# Patient Record
Sex: Female | Born: 1955 | Race: White | Hispanic: No | Smoking: Former smoker
Health system: Southern US, Community
[De-identification: ages and names within clinical notes are randomized; demographics above are authoritative.]

## PROBLEM LIST (undated history)

## (undated) DIAGNOSIS — B029 Zoster without complications: Secondary | ICD-10-CM

## (undated) DIAGNOSIS — N200 Calculus of kidney: Secondary | ICD-10-CM

## (undated) DIAGNOSIS — M1990 Unspecified osteoarthritis, unspecified site: Secondary | ICD-10-CM

## (undated) DIAGNOSIS — N816 Rectocele: Secondary | ICD-10-CM

## (undated) DIAGNOSIS — I10 Essential (primary) hypertension: Secondary | ICD-10-CM

## (undated) DIAGNOSIS — M65311 Trigger thumb, right thumb: Secondary | ICD-10-CM

## (undated) DIAGNOSIS — F4310 Post-traumatic stress disorder, unspecified: Secondary | ICD-10-CM

## (undated) HISTORY — PX: TONSILLECTOMY: SUR1361

## (undated) HISTORY — PX: SPINAL FUSION: SHX223

## (undated) HISTORY — PX: OOPHORECTOMY: SHX86

## (undated) HISTORY — PX: KNEE SURGERY: SHX244

## (undated) HISTORY — PX: APPENDECTOMY: SHX54

## (undated) HISTORY — PX: CHOLECYSTECTOMY: SHX55

## (undated) HISTORY — DX: Trigger thumb, right thumb: M65.311

## (undated) HISTORY — PX: HERNIA REPAIR: SHX51

## (undated) HISTORY — PX: OTHER SURGICAL HISTORY: SHX169

---

## 2001-09-17 ENCOUNTER — Observation Stay (HOSPITAL_COMMUNITY): Admission: RE | Admit: 2001-09-17 | Discharge: 2001-09-17 | Payer: Self-pay | Admitting: General Surgery

## 2001-09-18 ENCOUNTER — Emergency Department (HOSPITAL_COMMUNITY): Admission: EM | Admit: 2001-09-18 | Discharge: 2001-09-18 | Payer: Self-pay | Admitting: Emergency Medicine

## 2002-09-13 ENCOUNTER — Emergency Department (HOSPITAL_COMMUNITY): Admission: EM | Admit: 2002-09-13 | Discharge: 2002-09-13 | Payer: Self-pay | Admitting: Emergency Medicine

## 2003-09-10 ENCOUNTER — Other Ambulatory Visit: Admission: RE | Admit: 2003-09-10 | Discharge: 2003-09-10 | Payer: Self-pay | Admitting: Gynecology

## 2004-07-19 ENCOUNTER — Emergency Department (HOSPITAL_COMMUNITY): Admission: EM | Admit: 2004-07-19 | Discharge: 2004-07-19 | Payer: Self-pay | Admitting: Emergency Medicine

## 2004-10-22 ENCOUNTER — Emergency Department (HOSPITAL_COMMUNITY): Admission: EM | Admit: 2004-10-22 | Discharge: 2004-10-22 | Payer: Self-pay | Admitting: Emergency Medicine

## 2006-07-17 ENCOUNTER — Emergency Department (HOSPITAL_COMMUNITY): Admission: EM | Admit: 2006-07-17 | Discharge: 2006-07-17 | Payer: Self-pay | Admitting: Emergency Medicine

## 2006-11-17 ENCOUNTER — Emergency Department (HOSPITAL_COMMUNITY): Admission: EM | Admit: 2006-11-17 | Discharge: 2006-11-17 | Payer: Self-pay | Admitting: Emergency Medicine

## 2007-01-02 ENCOUNTER — Emergency Department (HOSPITAL_COMMUNITY): Admission: EM | Admit: 2007-01-02 | Discharge: 2007-01-02 | Payer: Self-pay | Admitting: Family Medicine

## 2007-02-26 ENCOUNTER — Emergency Department (HOSPITAL_COMMUNITY): Admission: EM | Admit: 2007-02-26 | Discharge: 2007-02-26 | Payer: Self-pay | Admitting: Emergency Medicine

## 2007-04-04 ENCOUNTER — Emergency Department (HOSPITAL_COMMUNITY): Admission: EM | Admit: 2007-04-04 | Discharge: 2007-04-05 | Payer: Self-pay | Admitting: Emergency Medicine

## 2007-04-17 ENCOUNTER — Emergency Department (HOSPITAL_COMMUNITY): Admission: EM | Admit: 2007-04-17 | Discharge: 2007-04-17 | Payer: Self-pay | Admitting: Family Medicine

## 2007-09-05 ENCOUNTER — Emergency Department (HOSPITAL_COMMUNITY): Admission: EM | Admit: 2007-09-05 | Discharge: 2007-09-05 | Payer: Self-pay | Admitting: Emergency Medicine

## 2008-04-24 ENCOUNTER — Emergency Department (HOSPITAL_COMMUNITY): Admission: EM | Admit: 2008-04-24 | Discharge: 2008-04-24 | Payer: Self-pay | Admitting: Emergency Medicine

## 2008-07-13 ENCOUNTER — Ambulatory Visit: Payer: Self-pay | Admitting: Internal Medicine

## 2008-07-13 ENCOUNTER — Ambulatory Visit: Payer: Self-pay | Admitting: Vascular Surgery

## 2008-07-13 ENCOUNTER — Encounter (INDEPENDENT_AMBULATORY_CARE_PROVIDER_SITE_OTHER): Payer: Self-pay | Admitting: Internal Medicine

## 2008-07-13 ENCOUNTER — Inpatient Hospital Stay (HOSPITAL_COMMUNITY): Admission: EM | Admit: 2008-07-13 | Discharge: 2008-07-14 | Payer: Self-pay | Admitting: Neurology

## 2009-08-23 ENCOUNTER — Emergency Department (HOSPITAL_COMMUNITY): Admission: EM | Admit: 2009-08-23 | Discharge: 2009-08-24 | Payer: Self-pay | Admitting: Emergency Medicine

## 2010-01-21 ENCOUNTER — Emergency Department (HOSPITAL_COMMUNITY): Admission: EM | Admit: 2010-01-21 | Discharge: 2010-01-21 | Payer: Self-pay | Admitting: Emergency Medicine

## 2010-07-16 LAB — CBC
HCT: 33.3 % — ABNORMAL LOW (ref 36.0–46.0)
HCT: 38.2 % (ref 36.0–46.0)
Hemoglobin: 11.3 g/dL — ABNORMAL LOW (ref 12.0–15.0)
MCHC: 34 g/dL (ref 30.0–36.0)
MCV: 89.4 fL (ref 78.0–100.0)
Platelets: 302 10*3/uL (ref 150–400)
RBC: 3.72 MIL/uL — ABNORMAL LOW (ref 3.87–5.11)
WBC: 8.6 10*3/uL (ref 4.0–10.5)

## 2010-07-16 LAB — COMPREHENSIVE METABOLIC PANEL
ALT: 19 U/L (ref 0–35)
ALT: 26 U/L (ref 0–35)
AST: 23 U/L (ref 0–37)
Albumin: 4 g/dL (ref 3.5–5.2)
Alkaline Phosphatase: 98 U/L (ref 39–117)
BUN: 15 mg/dL (ref 6–23)
BUN: 21 mg/dL (ref 6–23)
CO2: 26 mEq/L (ref 19–32)
CO2: 28 mEq/L (ref 19–32)
Calcium: 8.3 mg/dL — ABNORMAL LOW (ref 8.4–10.5)
Chloride: 105 mEq/L (ref 96–112)
Chloride: 99 mEq/L (ref 96–112)
Creatinine, Ser: 0.82 mg/dL (ref 0.4–1.2)
Creatinine, Ser: 1.35 mg/dL — ABNORMAL HIGH (ref 0.4–1.2)
GFR calc Af Amer: 50 mL/min — ABNORMAL LOW (ref >60)
Glucose, Bld: 155 mg/dL — ABNORMAL HIGH (ref 70–99)
Potassium: 3.3 mEq/L — ABNORMAL LOW (ref 3.5–5.1)
Potassium: 4.1 mEq/L (ref 3.5–5.1)
Sodium: 138 mEq/L (ref 135–145)
Sodium: 138 mEq/L (ref 135–145)
Total Bilirubin: 0.5 mg/dL (ref 0.3–1.2)
Total Protein: 5.7 g/dL — ABNORMAL LOW (ref 6.0–8.3)

## 2010-07-16 LAB — TROPONIN I: Troponin I: 0.04 ng/mL (ref 0.00–0.06)

## 2010-07-16 LAB — URINE CULTURE: Colony Count: 7000

## 2010-07-16 LAB — URINALYSIS, ROUTINE W REFLEX MICROSCOPIC
Ketones, ur: NEGATIVE mg/dL
Nitrite: NEGATIVE
Protein, ur: NEGATIVE mg/dL

## 2010-07-16 LAB — DIFFERENTIAL
Basophils Absolute: 0.1 10*3/uL (ref 0.0–0.1)
Basophils Relative: 1 % (ref 0–1)
Lymphocytes Relative: 19 % (ref 12–46)
Lymphs Abs: 2.5 10*3/uL (ref 0.7–4.0)
Monocytes Absolute: 0.7 10*3/uL (ref 0.1–1.0)
Neutrophils Relative %: 72 % (ref 43–77)

## 2010-07-16 LAB — RAPID URINE DRUG SCREEN, HOSP PERFORMED
Amphetamines: NOT DETECTED
Barbiturates: NOT DETECTED
Opiates: POSITIVE — AB
Tetrahydrocannabinol: NOT DETECTED

## 2010-07-16 LAB — T4, FREE: Free T4: 0.89 ng/dL (ref 0.80–1.80)

## 2010-08-19 NOTE — Discharge Summary (Signed)
Jane Murphy, Jane Murphy           ACCOUNT NO.:  [id number]   MEDICAL RECORD NO.:  [id number]          PATIENT TYPE:  INP   LOCATION:  1437                         FACILITY:  [facility]   PHYSICIAN:  Elliot Cousin, M.D.    DATE OF BIRTH:  03-02-1956   DATE OF ADMISSION:  07/13/2008  DATE OF DISCHARGE:  07/14/2008                               DISCHARGE SUMMARY   DISCHARGE DIAGNOSES:  1. Syncope.  Etiology unclear.  However, orthostatic hypotension and a      vasovagal reaction were considered.  2. Relative hypotension with history of hypertension.  3. Transient prolonged QTC and QT interval.  Completely resolved.  4. Reactive leukocytosis.  Completely resolved.  5. Acute renal insufficiency.  Completely resolved with IV fluids.  6. Hyperglycemia.  Hemoglobin A1c well within normal limits.   DISCHARGE MEDICATIONS:  1. Benicar HCTZ 10/12.5 mg daily (hold for at least 3 days and then      restart if your systolic blood pressure is greater than 110).  2. EpiPen p.r.n.   DISCHARGE DISPOSITION:  The patient is currently stable and in improved  condition.  She was advised to follow up with a primary care physician  of choice.  She was previously followed by Dr. Concepcion Elk.  The patient was  advised to follow up with her new primary care physician in  approximately 2 - 4 weeks.   CONSULTATIONS:  None.   PROCEDURE PERFORMED:  1. A 2D echocardiogram on July 13, 2008.  The results revealed an      overall left ventricular systolic function that was normal.      Ejection fraction estimated to be 55% - 65%.  No significant      valvular abnormalities.  2. Bilateral carotid Doppler study.  The preliminary results revealed      no bilateral ICA stenosis.  Vertebral flow antegrade.  3. CT scan of the head without contrast on July 13, 2008.  The results      revealed no significant intracranial abnormality.  4. Chest x-ray on July 13, 2008.  The results revealed no active      cardiopulmonary  disease.   HISTORY OF PRESENT ILLNESS:  The patient is a 55 year old woman with a  past medical history significant for hypertension, postherpetic  neuralgia, and multiple medication allergies.  She presented to the  emergency department on July 13, 2008 after experiencing a syncopal  episode.  When the patient was evaluated in the emergency department,  her initial blood pressure was borderline low at 90/53.  However, with  gentle IV fluids, it did increase to 97/55 before she was transferred to  the floor.  Her EKG revealed nonspecific T-wave abnormalities and a  prolonged QTC of 499.  Her CT scan of the head revealed no acute  intracranial abnormalities.  Her D-dimer was within normal limits at  0.30.  Her serum potassium was borderline low at 3.3.  Her creatinine  was slightly elevated at 1.35.  Her WBC was slightly elevated at 13.1.  The patient was admitted for further evaluation and management.   For additional details please see the dictated  history and physical.   HOSPITAL COURSE:  SYNCOPE.  As indicated above, the patient was started  on gentle IV fluids for volume repletion.  Given her relative  hypotension and elevated creatinine, the patient was essentially volume  depleted.  She has a history of hypertension and had been taking Benicar  HCTZ on a daily basis.  The Benicar HCTZ was withheld during the  hospitalization.  Because of the abnormalities seen on the EKG, a blood  magnesium level was ordered.  It was within normal limits at 2.3.  She  was started on potassium supplementation orally and in the IV fluids for  mild hypokalemia.  Cardiac enzymes were ordered to rule out myocardial  ischemia.  All of her cardiac enzymes were completely normal.  Her  thyroid function was assessed with a TSH and free T4.  Both were within  normal limits with the TSH being 1.88 and the free T4 being 0.89.  Her  urinalysis was essentially negative for infection.  There was mild   hyperglycemia at the time of the initial hospital assessment however it  resolved prior to hospital discharge.  Her hemoglobin A1c was noted to  be within normal limits at 5.6.  For additional evaluation, a urine drug  screen, 2D echocardiogram, and carotid Doppler study were ordered.  The  2D echocardiogram revealed preserved LV function and no obvious valvular  abnormalities.  The carotid Doppler study was negative for ICA stenosis.  Her urine drug screen was positive for opiates.  The patient denied  taking any opiates recently at home.  She said that she did take an over-  the-counter cough medication during the day prior to the hospital  admission.  She says that the cough medication was over-the-counter and  not prescribed.  It is possible that the medication in the over-the-  counter cough syrup caused a cross reaction with the lab test and  therefore the urine drug screen became positive for opiates.  Of note,  the patient did request Vicodin from the registered nurse this morning  for some nonspecific pain, however, it was not given.   The patient's follow-up electrolyte panel was essentially within normal  limits.  Her serum potassium is currently 4.1 and her white blood cell  count is 8.6 improved from 13.1.  She is neurologically intact.  She is  ambulating in her room.  There are no focal neurological findings.   It is possible that the patient may have experienced a vasovagal  reaction coupled with orthostasis.  These are the potential etiologies  of her syncope.  The patient was advised to withhold taking Benicar HCTZ  for at least 3 days and to avoid dehydration.  She was advised to  restart the Benicar HCTZ in 3 - 4 days if her systolic blood pressure is  greater than 110.  She voiced understanding.      Elliot Cousin, M.D.  Electronically Signed     DF/MEDQ  D:  07/14/2008  T:  07/14/2008  Job:  [id number]

## 2010-08-19 NOTE — H&P (Signed)
Jane Murphy, Jane Murphy           ACCOUNT NO.:  [id number]   MEDICAL RECORD NO.:  [id number]          PATIENT TYPE:  INP   LOCATION:  1437                         FACILITY:  [facility]   PHYSICIAN:  Elliot Cousin, M.D.    DATE OF BIRTH:  07-Mar-1956   DATE OF ADMISSION:  07/13/2008  DATE OF DISCHARGE:                              HISTORY & PHYSICAL   CHIEF COMPLAINT:  I passed out.   HISTORY OF PRESENT ILLNESS:  The patient is a 55 year old woman with a  past medical history significant for hypertension, postherpetic  neuralgia, and multiple medication allergies, who presented to the  emergency department last night after apparently experiencing a syncopal  episode.  At approximately midnight this morning, the patient ate a  grilled cheese sandwich along with a bowl of tomato soup.  Prior to her  late supper, she had no complaints of dizziness, headache, chest pain,  shortness of breath, nausea, vomiting, diarrhea, swelling in her legs,  painful urination, or fever and chills.  Approximately 1 hour later, she  became nauseated.  She went to her bathroom and sat on the toilet with a  closed  lid.  All of a certain, she heard a boom.  She found herself  on the floor and emesis was scattered over her face.  Her son came into  the bathroom and found her there.  She was somewhat disoriented however  she does not recall what happened other than hearing a boom and passing  out on the floor.  She apparently came to within a few seconds.  According to her son, there was some urinary incontinence.  There was no  stool incontinence.  Her only new medication was a Tussin cough syrup  that she had taken earlier in the day.  She also has a history of  syncope associated with orthostatic hypotension after significant  intentional weight loss and an increase in dose of Benicar  approximately a year or two ago.  As indicated above, the patient had no  other symptoms prior to her syncopal episode.  She  also notes that she  is allergic to MSG and she is not quite sure if MSG was in the tomato  soup that she ate.  She also notes that she has a number of medication  allergies, at least one of which can cause an anaphylactic reaction.   During the evaluation in the emergency department, the patient was noted  to be afebrile.  Her blood pressure was initially 90/53, however with  gentle IV fluids, it increased to 97/55.  Her EKG revealed nonspecific T-  wave abnormalities and a prolonged QTC of 499.  CT scan of the head  ordered by the emergency department physician revealed no acute  intracranial abnormality.  Her chest x-ray revealed no active  cardiopulmonary disease.  Her lab data were  significant for a normal D-  dimer of 0.30, a low serum potassium of 3.3, an elevated creatinine of  1.35, and a normal magnesium of 2.3.  Her WBC was also slightly elevated  at 13.1.  The patient was admitted for further evaluation and  management.   PAST MEDICAL HISTORY:  1. Hypertension.  2. History of herpes zoster  3. Postherpetic neuralgia.  4. Multiple medication allergies.  5. Status post two C-sections  6. Status post left salpingo-oophorectomy secondary to scar tissue  7. Right salpingectomy.  8. Status post umbilical hernia repair in the past.  9. Status post severe spider bite of the right arm..   ALLERGIES:  THE PATIENT HAS AN ALLERGY TO CODEINE WHICH CAUSES ITCHING,  COMPAZINE WHICH CAUSES HIVES, DARVOCET N 100 WHICH CAUSES HIVES AND  WHEEZING, LYRICA  WHICH CAUSES HIVES, MORPHINE WHICH CAUSES AN  ANAPHYLACTIC REACTION, ALL NSAIDS WHICH CAUSE HIVES, NEURONTIN WHICH  CAUSES WHEEZING, AND PHENERGAN WHICH CAUSES HIVES.   MEDICATIONS:  1. Benicar 12.5 mg daily.  2. Epipen as needed.   SOCIAL HISTORY:  The patient is widowed.  [place]  She is self-employed as a Secretary/administrator.  She has  two sons.  She drinks beer on occasion.  She denies  tobacco and illicit  drug use.   FAMILY HISTORY:  Her mother is 60 years of age and has no significant  medical history.  Her father is 48 years of age and only has a history  of skin cancer.   REVIEW OF SYSTEMS:  As above.   PHYSICAL EXAMINATION:  Temperature 99.6, blood pressure 93/53, pulse 97,  respiratory rate 18, oxygen saturation 99% on room air.  GENERAL:  The patient is a pleasant obese 55 year old Caucasian woman  who is currently sitting up in bed in no acute distress.  HEENT:  Head  is normocephalic nontraumatic.  I do not see any evidence of head trauma  or scalp trauma.  Pupils equal, round, reactive to light.  Extraocular  muscles are intact.  Conjunctivae are clear.  Sclerae are white.  Tympanic membranes not examined.  Nasal mucosa is mildly dry.  No sinus  tenderness.  Oropharynx reveals good dentition.  Mucous membranes are  mildly dry.  No posterior exudates or erythema.  NECK:  Supple.  No adenopathy, no thyromegaly, no bruit or JVD.  LUNGS:  Clear to auscultation bilaterally.  HEART:  S1-S2 with no murmurs, rubs or gallops.  ABDOMEN:  Positive bowel sounds, obese, nontender, nondistended.  No  hepatosplenomegaly, no masses palpated.  EXTREMITIES:  Pedal pulses are  palpable bilaterally.  No pretibial edema.  No pedal edema.  NEUROLOGIC:  The patient is alert and oriented x3.  Cranial nerves II-  XII are intact.  Strength is 5/5 throughout.  Sensation is intact.  Gait  is within normal limits.  No nystagmus.  No pronator drift.   INITIAL LABORATORIES:  EKG reveals normal sinus rhythm with a heart rate  of 92 beats per minute, nonspecific T-wave abnormalities, prolonged QT  404/499.  Chest x-ray and CT scan of the head results as above.  WBC  13.1, hemoglobin 12.9, platelets 302, D-dimer 0.30.  Sodium 138,  potassium 3.3, chloride 99, CO2 28, glucose 155, BUN 21, creatinine  1.35, total bilirubin 0.8, alkaline phosphatase 98, SGOT 37, SGPT 26,  total protein  7.0, albumin 4.0, calcium 9.2, magnesium 2.3.   ASSESSMENT:  1. Syncope.  The etiology is unclear at this time.  We are considering      orthostatic hypotension as a potential cause and vasovagal syncope      secondary to nausea and vomiting  and perhaps micturition.  Also of      note, the patient does have a  history of severe allergies and she      wonders if she reacted to MSG which caused her to become nauseated      which may have subsequently caused her to pass out.  She is      currently alert and oriented and has no focal neurological findings      on exam.  She does have a prolonged QT interval and this is a      concern.  Her blood magnesium is within normal limits, however.  2. Prolonged QT interval.  This finding may be incidental however, a      chronic abnormality is a concern.  This will be monitored during      the hospitalization.  As indicated above, her blood magnesium level      was within normal limits.  The EKG abnormalities may also be a      consequence of hypokalemia, although the hypokalemia is not      profound.  3. Mild hypokalemia.  The patient's serum potassium is 3.3.  This      could be a consequence of the HCT portion of her antihypertensive      medication therapy.  4. Relative hypotension.  As above, the patient may have experienced      orthostatic changes that could have led to the syncopal episode.  5. Leukocytosis.  The patient has no evidence of fever.  The      leukocytosis may simply be reactive.  6. Acute renal insufficiency.  The patient's creatinine is 1.35 which      in addition to the relative hypotension, leads one to believe that      the patient is volume depleted.  7. Hyperglycemia.  The patient's glucose is 155.  She gives no history      of diabetes mellitus.  It is unclear whether or not the patient      received glucose and/or dextrose via the IV fluids in the emergency      department.   PLAN:  1. The patient is being admitted  for further evaluation and      management.  2. Will replete her potassium chloride orally and in the IV fluids.  3. Will start IV fluid volume repletion with normal saline with      potassium chloride added.  Will hold the Benicar HCTZ for now.  4. Will recheck a follow-up EKG in the morning.  5. For further evaluation, will check orthostatic vital signs, cardiac      enzymes, 2-D echocardiogram, and a carotid Doppler study.  6. Will check a urinalysis to rule out infection.  Will check a urine      drug screen to rule out any illicit drug use.      Elliot Cousin, M.D.  Electronically Signed     DF/MEDQ  D:  07/13/2008  T:  07/13/2008  Job:  [id number]

## 2010-08-22 NOTE — Op Note (Signed)
[facility]  Patient:    MODEAN, MCCULLUM Visit Number: [id number] MRN: [id number]          Service Type: OBV Location: 4W 0478 01 Attending Physician:  Brandy Hale Dictated by:   Angelia Mould. Derrell Lolling, M.D. Proc. Date: 09/17/01 Admit Date:  09/17/2001   CC:         [facility]   Operative Report  PREOPERATIVE DIAGNOSIS:  Recurrent ventral hernia.  POSTOPERATIVE DIAGNOSIS:  Recurrent ventral hernia.  OPERATION PERFORMED:  Laparoscopic repair of recurrent ventral hernia using 12 cm x 12 cm parietex composite mesh.  SURGEON:  Angelia Mould. Derrell Lolling, M.D.  INDICATIONS FOR PROCEDURE:  This is a 55 year old white female who had a painful hernia around her umbilicus which was repaired about one year ago. I am not sure of the exact technique. She developed sudden pain in her umbilicus in April of this year while lifting something at work. She developed a bulge. The bulge and the pain had been getting worse. On exam, she is obese and has a periumbilical hernia with a defect almost 3 cm in diameter and a well healed skin scar. This seems reducible. This is tender. She is brought to the operating room for repair.  OPERATIVE TECHNIQUE:  Following the induction of general endotracheal anesthesia, a Foley catheter was inserted to empty the bladder. She was given intravenous antibiotics. The abdomen was prepped and draped in a sterile fashion. The 2 cm long transverse incision was made in the left lateral abdomen. The external oblique, internal oblique and transversalis abdominis muscle layers were divided under direct vision and the abdominal cavity entered carefully under direct vision in an atraumatic fashion. A 10 mm Hasson trocar was inserted and secured with a pursestring suture of #0 Vicryl. Pneumoperitoneum was created. We found a hernia defect almost 4 cm in diameter in the periumbilical area. There were some omental adhesions  to this which were taken down. We inserted a 5 mm trocar in the left upper quadrant, 5 mm trocar in the left lower quadrant, and a 5 mm trocar in the right mid abdomen. The omental adhesions were divided carefully and under direct vision freeing up the edges of the hernia sac. Spinal needles were passed through the abdominal wall superiorly, inferiorly on the right and on the left to mark the edges of the hernia defect. We then marked a circle around this to mark the perimeter of the mesh so that we could have mesh overlapping this area 3-4 cm in all directions and that wound up being about 12 cm in diameter as a fairly regular circle. This was marked on the abdominal wall. We brought a 12 cm x 12 cm piece of parietex composite mesh with polyester on one side and a hydrophilic polyethylene glycol film on the other side. The mesh was marked superiorly inferior right and left. Mattress sutures of #0 Novofil are placed in the mesh at the 4 points of the compass. The mesh was then rolled up and inserted into the abdomen and then opened up and oriented being careful to place the hydrophilic polyethylene glycol surface toward the intestine and the polyester mesh toward the abdominal wall. Using four incisions at the 4 points of the compass, we then passed the sutures from the abdominal cavity out being sure to take at least a 1 cm bite of the fascia. Once all of these sutures were passed, we then lifted the mesh up and we found that it covered the  defect nicely and had no redundancy or folds in the mesh. We tied all four sutures which secured the mesh at the 4 points on the compass.  We used a 5 mm tacker and placed about 25 or so 5 mm metal tacks to secure the mesh at the perimeter and a few more centrally. This provided a very secure coverage with good fixation.  There was no bleeding at this point. When we removed the 5 mm trocar in the left lower quadrant, we found some bleeding from that  site and we controlled that nicely with a #0 Vicryl suture passed around the fascia with the endostitch device. This controlled the bleeding. The rest of the incisions looked fine. The rest of the trocars were removed and there was no bleeding. The pneumoperitoneum was released. The fascia and the left flank incision was closed with #0 Vicryl sutures. The skin incisions were closed with subcuticular sutures of 4-0 Vicryl and Steri-Strips. Clean bandages were placed and the patient taken to the recovery room in stable condition. Estimated blood loss was about 25 cc. Complications none. Sponge, needle and instrument counts were correct. Dictated by:   Angelia Mould. Derrell Lolling, M.D. Attending Physician:  Brandy Hale DD:  09/17/01 TD:  09/18/01 Job: [id number]

## 2010-10-24 ENCOUNTER — Emergency Department (HOSPITAL_COMMUNITY)
Admission: EM | Admit: 2010-10-24 | Discharge: 2010-10-24 | Disposition: A | Discharge status: Home / Self Care | Payer: Self-pay | Attending: Emergency Medicine | Admitting: Emergency Medicine

## 2010-10-24 DIAGNOSIS — E7800 Pure hypercholesterolemia, unspecified: Secondary | ICD-10-CM | POA: Insufficient documentation

## 2010-10-24 DIAGNOSIS — M545 Low back pain, unspecified: Secondary | ICD-10-CM | POA: Insufficient documentation

## 2010-10-24 DIAGNOSIS — I10 Essential (primary) hypertension: Secondary | ICD-10-CM | POA: Insufficient documentation

## 2010-10-24 DIAGNOSIS — M5430 Sciatica, unspecified side: Secondary | ICD-10-CM | POA: Insufficient documentation

## 2010-12-06 ENCOUNTER — Emergency Department (HOSPITAL_COMMUNITY)
Admission: EM | Admit: 2010-12-06 | Discharge: 2010-12-06 | Disposition: A | Discharge status: Home / Self Care | Payer: Non-veteran care | Attending: Emergency Medicine | Admitting: Emergency Medicine

## 2010-12-06 DIAGNOSIS — F3289 Other specified depressive episodes: Secondary | ICD-10-CM | POA: Insufficient documentation

## 2010-12-06 DIAGNOSIS — B029 Zoster without complications: Secondary | ICD-10-CM | POA: Insufficient documentation

## 2010-12-06 DIAGNOSIS — I10 Essential (primary) hypertension: Secondary | ICD-10-CM | POA: Insufficient documentation

## 2010-12-06 DIAGNOSIS — E7800 Pure hypercholesterolemia, unspecified: Secondary | ICD-10-CM | POA: Insufficient documentation

## 2010-12-06 DIAGNOSIS — F329 Major depressive disorder, single episode, unspecified: Secondary | ICD-10-CM | POA: Insufficient documentation

## 2011-05-03 ENCOUNTER — Emergency Department (HOSPITAL_COMMUNITY): Payer: Non-veteran care

## 2011-05-03 ENCOUNTER — Encounter (HOSPITAL_COMMUNITY): Payer: Self-pay | Admitting: *Deleted

## 2011-05-03 ENCOUNTER — Emergency Department (HOSPITAL_COMMUNITY)
Admission: EM | Admit: 2011-05-03 | Discharge: 2011-05-03 | Disposition: A | Discharge status: Home / Self Care | Payer: Non-veteran care | Attending: Emergency Medicine | Admitting: Emergency Medicine

## 2011-05-03 DIAGNOSIS — W108XXA Fall (on) (from) other stairs and steps, initial encounter: Secondary | ICD-10-CM | POA: Insufficient documentation

## 2011-05-03 DIAGNOSIS — R079 Chest pain, unspecified: Secondary | ICD-10-CM | POA: Insufficient documentation

## 2011-05-03 DIAGNOSIS — R071 Chest pain on breathing: Secondary | ICD-10-CM | POA: Insufficient documentation

## 2011-05-03 DIAGNOSIS — R0789 Other chest pain: Secondary | ICD-10-CM

## 2011-05-03 DIAGNOSIS — S335XXA Sprain of ligaments of lumbar spine, initial encounter: Secondary | ICD-10-CM | POA: Insufficient documentation

## 2011-05-03 HISTORY — DX: Zoster without complications: B02.9

## 2011-05-03 HISTORY — DX: Post-traumatic stress disorder, unspecified: F43.10

## 2011-05-03 HISTORY — DX: Unspecified osteoarthritis, unspecified site: M19.90

## 2011-05-03 HISTORY — DX: Essential (primary) hypertension: I10

## 2011-05-03 HISTORY — DX: Rectocele: N81.6

## 2011-05-03 MED ORDER — OXYCODONE-ACETAMINOPHEN 5-325 MG PO TABS: 1.0000 | ORAL_TABLET | ORAL | Status: AC | PRN

## 2011-05-03 MED ORDER — OXYCODONE-ACETAMINOPHEN 5-325 MG PO TABS
2.0000 | ORAL_TABLET | Freq: Once | ORAL | Status: AC
  Administered 2011-05-03: 2 via ORAL
  Filled 2011-05-03: qty 2

## 2011-05-03 NOTE — ED Provider Notes (Signed)
History     CSN: [id number]  Arrival date & time 05/03/11  0865   First MD Initiated Contact with Patient 05/03/11 1156      Chief Complaint  Patient presents with  . Fall  . Back Pain    Left lower  . Leg Pain    left    (Consider location/radiation/quality/duration/timing/severity/associated sxs/prior treatment) The history is provided by the patient.   the patient reports slipping while walking down stairs 3 days ago.  She reports striking her right lateral chest on the arm rail.  She denies head or neck pain.  She denies loss consciousness.  She had no head injury.  She also reports pain in her lower back from twisting.  She did not fall and strike her back.  She denies weakness in her upper or lower extremities.  She has no shortness of breath.  She reports her pain is worse by deep breathing.  Nothing improves her pain.  She's only been trying Tylenol for pain.  Her pain is moderate in severity.  Her pain is constant.  She denies fevers and chills.  She denies bowel pain.  She denies nausea vomiting or diarrhea.  She denies lightheadedness or weakness.  Past Medical History  Diagnosis Date  . Arthritis   . Osteoarthritis   . Hypertension   . PTSD (post-traumatic stress disorder)   . Shingles   . Rectocele     Past Surgical History  Procedure Date  . C section   . Oophorectomy   . Hernia repair   . Tonsillectomy   . Appendectomy   . Cholecystectomy     History reviewed. No pertinent family history.  History  Substance Use Topics  . Smoking status: Never Smoker   . Smokeless tobacco: Never Used  . Alcohol Use: Yes     occ    OB History    Grav Para Term Preterm Abortions TAB SAB Ect Mult Living                  Review of Systems  Musculoskeletal: Positive for back pain.  All other systems reviewed and are negative.    Allergies  Aspirin; Nsaids; Other; and Food  Home Medications  No current outpatient prescriptions on file.  BP 135/93  Pulse  88  Temp(Src) 97.6 F (36.4 C) (Oral)  Resp 16  Ht 5\' 7"  (1.702 m)  Wt 240 lb (108.863 kg)  BMI 37.59 kg/m2  SpO2 97%  Physical Exam  Nursing note and vitals reviewed. Constitutional: She is oriented to person, place, and time. She appears well-developed and well-nourished. No distress.  HENT:  Head: Normocephalic and atraumatic.  Eyes: EOM are normal.  Neck: Normal range of motion.  Cardiovascular: Normal rate, regular rhythm and normal heart sounds.   Pulmonary/Chest: Effort normal and breath sounds normal.       Right chest wall tenderness without deformity.  No crepitus noted.  Lung sounds are clear and equal bilaterally  Abdominal: Soft. She exhibits no distension. There is no tenderness.  Musculoskeletal: Normal range of motion.       Mild lumbar spinal tenderness or paraspinal tenderness.  No obvious step-offs.  Normal motor strength in her bilateral lower extremitties  Neurological: She is alert and oriented to person, place, and time.  Skin: Skin is warm and dry.  Psychiatric: She has a normal mood and affect. Judgment normal.    ED Course  Procedures (including critical care time)  Labs Reviewed - No data  to display Dg Chest 2 View  05/03/2011  *RADIOLOGY REPORT*  Clinical Data: Fall.  Chest pain  CHEST - 2 VIEW  Comparison: 07/13/2008  Findings: The heart size and mediastinal contours are within normal limits.  Left upper lobe granuloma is similar to previous exam. Both lungs are clear.  The visualized skeletal structures are unremarkable.  IMPRESSION:  1.  No acute cardiopulmonary abnormalities.  Original Report Authenticated By: Rosealee Albee, M.D.   Dg Lumbar Spine Complete  05/03/2011  *RADIOLOGY REPORT*  Clinical Data: Fall.  Chest pain.  LUMBAR SPINE - COMPLETE 4+ VIEW  Comparison: 02/26/2007  Findings: Hernia mesh noted projecting over the lower abdomen.  Facet arthropathy is once again observed at L5-S1 and L4-5 bilaterally.  There is 3 mm of grade 1  anterolisthesis of L4 on L5, similar to the prior exam.  Reduced intervertebral disc height that L5-S1 noted, probably reflecting degenerative disc disease.  IMPRESSION:  1.  Lower lumbar facet arthropathy with reduced disc height that L5- S1, compatible with spondylosis and degenerative disc disease. 2.  Unchanged 3 mm of degenerative anterolisthesis of L4 on L5. 3.  No fracture or acute subluxation in the lumbar spine is observed.  Original Report Authenticated By: Dellia Cloud, M.D.     1. Chest wall pain   2. Lumbar sprain       MDM  Will image chest in the lumbar spine.  Pain treated in the emergency department.  Suspect contusions versus nondisplaced right rib fracture.  Chest x-ray to better deliniate.  Low suspicion for pneumothorax.        Lyanne Co, MD 05/03/11 1247

## 2011-05-03 NOTE — ED Notes (Signed)
Pt from home c/o right rib cage pain, left lower back pain radiating down left leg after slipping on ice and falling down 3 steps.

## 2011-07-01 ENCOUNTER — Emergency Department (HOSPITAL_COMMUNITY)
Admission: EM | Admit: 2011-07-01 | Discharge: 2011-07-01 | Disposition: A | Discharge status: Home / Self Care | Payer: Non-veteran care | Attending: Emergency Medicine | Admitting: Emergency Medicine

## 2011-07-01 ENCOUNTER — Encounter (HOSPITAL_COMMUNITY): Payer: Self-pay | Admitting: *Deleted

## 2011-07-01 DIAGNOSIS — M1990 Unspecified osteoarthritis, unspecified site: Secondary | ICD-10-CM | POA: Insufficient documentation

## 2011-07-01 DIAGNOSIS — M5430 Sciatica, unspecified side: Secondary | ICD-10-CM | POA: Insufficient documentation

## 2011-07-01 DIAGNOSIS — I10 Essential (primary) hypertension: Secondary | ICD-10-CM | POA: Insufficient documentation

## 2011-07-01 DIAGNOSIS — F4310 Post-traumatic stress disorder, unspecified: Secondary | ICD-10-CM | POA: Insufficient documentation

## 2011-07-01 DIAGNOSIS — M549 Dorsalgia, unspecified: Secondary | ICD-10-CM

## 2011-07-01 MED ORDER — OXYCODONE-ACETAMINOPHEN 5-325 MG PO TABS: 1.0000 | ORAL_TABLET | ORAL | Status: AC | PRN

## 2011-07-01 MED ORDER — DEXAMETHASONE SODIUM PHOSPHATE 10 MG/ML IJ SOLN
10.0000 mg | Freq: Once | INTRAMUSCULAR | Status: AC
  Administered 2011-07-01: 10 mg via INTRAMUSCULAR

## 2011-07-01 MED ORDER — DEXAMETHASONE SODIUM PHOSPHATE 10 MG/ML IJ SOLN
10.0000 mg | Freq: Once | INTRAMUSCULAR | Status: DC
  Filled 2011-07-01: qty 1

## 2011-07-01 MED ORDER — OXYCODONE-ACETAMINOPHEN 5-325 MG PO TABS
1.0000 | ORAL_TABLET | Freq: Once | ORAL | Status: AC
  Administered 2011-07-01: 1 via ORAL
  Filled 2011-07-01: qty 1

## 2011-07-01 NOTE — ED Provider Notes (Signed)
History     CSN: [id number]  Arrival date & time 07/01/11  1203   First MD Initiated Contact with Patient 07/01/11 1239      Chief Complaint  Patient presents with  . Back Pain  . Hip Pain    (Consider location/radiation/quality/duration/timing/severity/associated sxs/prior treatment) Patient is a 56 y.o. female presenting with back pain and hip pain. The history is provided by the patient. No language interpreter was used.  Back Pain  This is a chronic problem. The problem occurs constantly. The problem has been gradually worsening. The pain is associated with no known injury. The pain is at a severity of 8/10. The pain is moderate. The symptoms are aggravated by stress. The pain is worse during the day. Associated symptoms include leg pain. Pertinent negatives include no fever, no numbness, no bowel incontinence, no perianal numbness, no bladder incontinence, no dysuria, no pelvic pain, no paresthesias, no paresis, no tingling and no weakness. She has tried analgesics for the symptoms. The treatment provided mild relief. Risk factors: arthritis and osteoarthritis.  Hip Pain This is a chronic problem. The current episode started more than 1 year ago. The problem occurs daily. The problem has been gradually worsening. Pertinent negatives include no chills, fever, joint swelling, myalgias, nausea, numbness, urinary symptoms, vomiting or weakness. The symptoms are aggravated by walking. She has tried acetaminophen for the symptoms. The treatment provided mild relief.   Patient reports chronic osteoarthritis to her left knee with intermittent L lower back pain and sciatica. Presents today with sciatica to the left buttocks going down the anterior left thigh to the left knee. Was here a month ago with the same. States that the dexamethasone shot helps her pain immensely. And Percocet also helps her pain. She is scheduled for surgery he to her left knee on 11/16/2011 at the [facility]. States that  she is incontinent of urine and stool do to a rectocele and a urocele times many many years.  Denies perineal numbness or numbness in the left lower extremity. Has been taking Tylenol 1 g 3 times a day but has not had Tylenol today. The pain is keeping her up at night. Past medical history arthritis osteoarthritis hypertension and PTSD with rectocele.   Past Medical History  Diagnosis Date  . Arthritis   . Osteoarthritis   . Hypertension   . PTSD (post-traumatic stress disorder)   . Shingles   . Rectocele     Past Surgical History  Procedure Date  . C section   . Oophorectomy   . Hernia repair   . Tonsillectomy   . Appendectomy   . Cholecystectomy     No family history on file.  History  Substance Use Topics  . Smoking status: Never Smoker   . Smokeless tobacco: Never Used  . Alcohol Use: Yes     occ    OB History    Grav Para Term Preterm Abortions TAB SAB Ect Mult Living                  Review of Systems  Constitutional: Negative.  Negative for fever and chills.  HENT: Negative.   Eyes: Negative.   Respiratory: Negative.   Cardiovascular: Negative.   Gastrointestinal: Negative.  Negative for nausea, vomiting and bowel incontinence.  Genitourinary: Negative for bladder incontinence, dysuria, urgency, frequency, decreased urine volume, difficulty urinating and pelvic pain.  Musculoskeletal: Positive for back pain. Negative for myalgias and joint swelling.  Neurological: Negative.  Negative for tingling, weakness,  numbness and paresthesias.  Psychiatric/Behavioral: Negative.     Allergies  Aspirin; Nsaids; Other; and Food  Home Medications   Current Outpatient Rx  Name Route Sig Dispense Refill  . ACETAMINOPHEN 500 MG PO TABS Oral Take 1,000 mg by mouth every 6 (six) hours as needed. For pain.    Marland Kitchen VITAMIN D 1000 UNITS PO TABS Oral Take 1,000 Units by mouth daily.    Marland Kitchen LOSARTAN POTASSIUM 100 MG PO TABS Oral Take 50 mg by mouth daily.    . SERTRALINE HCL 50  MG PO TABS Oral Take 50 mg by mouth daily.      BP 152/99  Pulse 92  Temp(Src) 98 F (36.7 C) (Oral)  Resp 16  SpO2 96%  Physical Exam  Nursing note and vitals reviewed. Constitutional: She is oriented to person, place, and time. She appears well-developed and well-nourished.  HENT:  Head: Normocephalic and atraumatic.  Eyes: Conjunctivae and EOM are normal. Pupils are equal, round, and reactive to light.  Neck: Normal range of motion.  Cardiovascular: Normal rate.   Pulmonary/Chest: Effort normal.  Abdominal: Soft.  Musculoskeletal: Normal range of motion. She exhibits tenderness. She exhibits no edema.       LL back and LLE tendenderness with 2+ L knee reflex  Neurological: She is alert and oriented to person, place, and time. She has normal reflexes. No cranial nerve deficit.  Skin: Skin is warm and dry.  Psychiatric: She has a normal mood and affect.    ED Course  Procedures (including critical care time)  Labs Reviewed - No data to display No results found.   No diagnosis found.    MDM  LLback and sciatica treated in ER with Dexamethasone 10mg  and percocet with relief.  Will follow up with Pcp Dr. Vincenza Hews this week.  Keep appointment for pending surgery.  Return to ER if worse.        Jethro Bastos, NP 07/02/11 [phone]

## 2011-07-01 NOTE — ED Notes (Signed)
Pt reports sciatica flare up. Pt reports pain to left hip/lower back pain.

## 2011-07-01 NOTE — Discharge Instructions (Signed)
Jane Murphy retreated her sciatica with dexamethasone 10 mg IM and we gave you Percocet while in the ER. He had an prescription for Percocet do not drive with this medication. Do not take ibuprofen for the next few days. Get a followup appointment with the [name] hospital as his possible. Return to the ER for any severe pain or other concerns.   Back Pain, Adult Back pain is very common. The pain often gets better over time. The cause of back pain is usually not dangerous. Most people can learn to manage their back pain on their own.  HOME CARE   Stay active. Start with short walks on flat ground if you can. Try to walk farther each day.   Do not sit, drive, or stand in one place for more than 30 minutes. Do not stay in bed.   Do not avoid exercise or work. Activity can help your back heal faster.   Be careful when you bend or lift an object. Bend at your knees, keep the object close to you, and do not twist.   Sleep on a firm mattress. Lie on your side, and bend your knees. If you lie on your back, put a pillow under your knees.   Only take medicines as told by your doctor.   Put ice on the injured area.   Put ice in a plastic bag.   Place a towel between your skin and the bag.   Leave the ice on for 15 to 20 minutes, 3 to 4 times a day for the first 2 to 3 days. After that, you can switch between ice and heat packs.   Ask your doctor about back exercises or massage.   Avoid feeling anxious or stressed. Find good ways to deal with stress, such as exercise.  GET HELP RIGHT AWAY IF:   Your pain does not go away with rest or medicine.   Your pain does not go away in 1 week.   You have new problems.   You do not feel well.   The pain spreads into your legs.   You cannot control when you poop (bowel movement) or pee (urinate).   Your arms or legs feel weak or lose feeling (numbness).   You feel sick to your stomach (nauseous) or throw up (vomit).   You have belly (abdominal)  pain.   You feel like you may pass out (faint).  MAKE SURE YOU:   Understand these instructions.   Will watch your condition.   Will get help right away if you are not doing well or get worse.  Document Released: 09/09/2007 Document Revised: 03/12/2011 Document Reviewed: 08/11/2010 [facility] Patient Information 2012 [place], [place].Back Pain, Adult Back pain is very common. The pain often gets better over time. The cause of back pain is usually not dangerous. Most people can learn to manage their back pain on their own.  HOME CARE   Stay active. Start with short walks on flat ground if you can. Try to walk farther each day.   Do not sit, drive, or stand in one place for more than 30 minutes. Do not stay in bed.   Do not avoid exercise or work. Activity can help your back heal faster.   Be careful when you bend or lift an object. Bend at your knees, keep the object close to you, and do not twist.   Sleep on a firm mattress. Lie on your side, and bend your knees. If you lie on  your back, put a pillow under your knees.   Only take medicines as told by your doctor.   Put ice on the injured area.   Put ice in a plastic bag.   Place a towel between your skin and the bag.   Leave the ice on for 15 to 20 minutes, 3 to 4 times a day for the first 2 to 3 days. After that, you can switch between ice and heat packs.   Ask your doctor about back exercises or massage.   Avoid feeling anxious or stressed. Find good ways to deal with stress, such as exercise.  GET HELP RIGHT AWAY IF:   Your pain does not go away with rest or medicine.   Your pain does not go away in 1 week.   You have new problems.   You do not feel well.   The pain spreads into your legs.   You cannot control when you poop (bowel movement) or pee (urinate).   Your arms or legs feel weak or lose feeling (numbness).   You feel sick to your stomach (nauseous) or throw up (vomit).   You have belly (abdominal)  pain.   You feel like you may pass out (faint).  MAKE SURE YOU:   Understand these instructions.   Will watch your condition.   Will get help right away if you are not doing well or get worse.  Document Released: 09/09/2007 Document Revised: 03/12/2011 Document Reviewed: 08/11/2010 [facility] Patient Information 2012 [place], [place].

## 2011-07-02 NOTE — ED Provider Notes (Signed)
Medical screening examination/treatment/procedure(s) were performed by non-physician practitioner and as supervising physician I was immediately available for consultation/collaboration.   Rayaan Garguilo, MD 07/02/11 1807 

## 2011-08-27 ENCOUNTER — Encounter (HOSPITAL_COMMUNITY): Payer: Self-pay | Admitting: Family Medicine

## 2011-08-27 ENCOUNTER — Emergency Department (HOSPITAL_COMMUNITY)
Admission: EM | Admit: 2011-08-27 | Discharge: 2011-08-27 | Disposition: A | Discharge status: Home / Self Care | Payer: Non-veteran care | Attending: Emergency Medicine | Admitting: Emergency Medicine

## 2011-08-27 DIAGNOSIS — I10 Essential (primary) hypertension: Secondary | ICD-10-CM | POA: Insufficient documentation

## 2011-08-27 DIAGNOSIS — R51 Headache: Secondary | ICD-10-CM | POA: Insufficient documentation

## 2011-08-27 DIAGNOSIS — B029 Zoster without complications: Secondary | ICD-10-CM | POA: Insufficient documentation

## 2011-08-27 MED ORDER — VALACYCLOVIR HCL 1 G PO TABS: 1000.0000 mg | ORAL_TABLET | Freq: Three times a day (TID) | ORAL | Status: AC

## 2011-08-27 MED ORDER — OXYCODONE-ACETAMINOPHEN 5-325 MG PO TABS: 1.0000 | ORAL_TABLET | ORAL | Status: AC | PRN

## 2011-08-27 NOTE — ED Notes (Signed)
Pt sts she gets shingles outbreaks a couple times a year. Reports pain in nose, forehead and behind left eye started this am.

## 2011-08-27 NOTE — ED Provider Notes (Signed)
Medical screening examination/treatment/procedure(s) were performed by non-physician practitioner and as supervising physician I was immediately available for consultation/collaboration.  Manar Smalling, MD 08/27/11 2355 

## 2011-08-27 NOTE — Discharge Instructions (Signed)

## 2011-08-27 NOTE — ED Notes (Signed)
Patient states she has a history of trigeminal neuralgia and gets shingles when she is under stress. States that she started having redness and pain this morning. Needs Valtrex.

## 2011-08-27 NOTE — ED Provider Notes (Signed)
History     CSN: [id number]  Arrival date & time 08/27/11  2017   First MD Initiated Contact with Patient 08/27/11 2108      Chief Complaint  Patient presents with  . Pain    nerve pain associated with shingles    (Consider location/radiation/quality/duration/timing/severity/associated sxs/prior treatment) HPI Comments: Patient with a history of facial shingles presents with new onset of same symptoms.  States she has noticed blisters beginning to form to left forehead and she has the sensation to her left face as well.  States when she gets this, she normally takes valtrex and pain medication.  Patient is a 56 y.o. female presenting with rash. The history is provided by the patient. No language interpreter was used.  Rash  This is a new problem. The current episode started yesterday. The problem has not changed since onset.The problem is associated with nothing. There has been no fever. The rash is present on the scalp and face. The pain is at a severity of 8/10. The pain is severe. The pain has been constant since onset. Associated symptoms include blisters and pain. Pertinent negatives include no weeping. She has tried nothing for the symptoms. The treatment provided no relief.    Past Medical History  Diagnosis Date  . Arthritis   . Osteoarthritis   . Hypertension   . PTSD (post-traumatic stress disorder)   . Shingles   . Rectocele     Past Surgical History  Procedure Date  . C section   . Oophorectomy   . Hernia repair   . Tonsillectomy   . Appendectomy   . Cholecystectomy     No family history on file.  History  Substance Use Topics  . Smoking status: Never Smoker   . Smokeless tobacco: Never Used  . Alcohol Use: Yes     Occasional     OB History    Grav Para Term Preterm Abortions TAB SAB Ect Mult Living                  Review of Systems  Skin: Positive for rash.  All other systems reviewed and are negative.    Allergies  Aspirin; Nsaids;  Other; and Food  Home Medications   Current Outpatient Rx  Name Route Sig Dispense Refill  . ACETAMINOPHEN 500 MG PO TABS Oral Take 1,000 mg by mouth every 6 (six) hours as needed. For pain.    Marland Kitchen VITAMIN D 1000 UNITS PO TABS Oral Take 1,000 Units by mouth once a week. Takes every Saturdays    . LOSARTAN POTASSIUM 100 MG PO TABS Oral Take 50 mg by mouth daily.    . SERTRALINE HCL 50 MG PO TABS Oral Take 100 mg by mouth daily.       BP 178/107  Pulse 92  Temp(Src) 99 F (37.2 C) (Oral)  Resp 16  SpO2 96%  Physical Exam  Nursing note and vitals reviewed. Constitutional: She is oriented to person, place, and time. She appears well-developed and well-nourished. No distress.  HENT:  Head: Atraumatic.  Right Ear: External ear normal.  Left Ear: External ear normal.  Nose: Nose normal.  Mouth/Throat: Oropharynx is clear and moist. No oropharyngeal exudate.       Erythema and two small vesicles noted to left forehead  Eyes: Conjunctivae are normal. Pupils are equal, round, and reactive to light. No scleral icterus.  Neck: Normal range of motion. Neck supple.  Cardiovascular: Normal rate, regular rhythm and normal heart sounds.  Exam reveals no gallop and no friction rub.   No murmur heard. Pulmonary/Chest: Effort normal and breath sounds normal. No respiratory distress. She has no wheezes. She has no rales. She exhibits no tenderness.  Abdominal: Soft. Bowel sounds are normal. She exhibits no distension and no mass. There is no tenderness. There is no rebound and no guarding.  Musculoskeletal: Normal range of motion. She exhibits no edema and no tenderness.  Lymphadenopathy:    She has no cervical adenopathy.  Neurological: She is alert and oriented to person, place, and time. No cranial nerve deficit.  Skin: Skin is warm and dry. No rash noted. No erythema. No pallor.  Psychiatric: She has a normal mood and affect. Her behavior is normal. Judgment and thought content normal.    ED  Course  Procedures (including critical care time)  Labs Reviewed - No data to display No results found.   Shingles    MDM  Patient with a history of recurrent varicella infections presents with pain and burning sensation to left side of her face which is her usual presentation of this - will prescribe the valtrex and percocet for pain for short course.        Izola Price [place], [place] 08/27/11 2133

## 2011-11-21 ENCOUNTER — Emergency Department (HOSPITAL_COMMUNITY)
Admission: EM | Admit: 2011-11-21 | Discharge: 2011-11-21 | Disposition: A | Discharge status: Home / Self Care | Payer: Self-pay | Attending: Emergency Medicine | Admitting: Emergency Medicine

## 2011-11-21 ENCOUNTER — Emergency Department (HOSPITAL_COMMUNITY): Payer: Self-pay

## 2011-11-21 ENCOUNTER — Encounter (HOSPITAL_COMMUNITY): Payer: Self-pay | Admitting: Emergency Medicine

## 2011-11-21 DIAGNOSIS — W108XXA Fall (on) (from) other stairs and steps, initial encounter: Secondary | ICD-10-CM | POA: Insufficient documentation

## 2011-11-21 DIAGNOSIS — I10 Essential (primary) hypertension: Secondary | ICD-10-CM | POA: Insufficient documentation

## 2011-11-21 DIAGNOSIS — Z8739 Personal history of other diseases of the musculoskeletal system and connective tissue: Secondary | ICD-10-CM | POA: Insufficient documentation

## 2011-11-21 DIAGNOSIS — Z23 Encounter for immunization: Secondary | ICD-10-CM | POA: Insufficient documentation

## 2011-11-21 DIAGNOSIS — W19XXXA Unspecified fall, initial encounter: Secondary | ICD-10-CM

## 2011-11-21 DIAGNOSIS — M25579 Pain in unspecified ankle and joints of unspecified foot: Secondary | ICD-10-CM | POA: Insufficient documentation

## 2011-11-21 DIAGNOSIS — Z9089 Acquired absence of other organs: Secondary | ICD-10-CM | POA: Insufficient documentation

## 2011-11-21 DIAGNOSIS — M25569 Pain in unspecified knee: Secondary | ICD-10-CM | POA: Insufficient documentation

## 2011-11-21 MED ORDER — TETANUS-DIPHTH-ACELL PERTUSSIS 5-2.5-18.5 LF-MCG/0.5 IM SUSP
0.5000 mL | Freq: Once | INTRAMUSCULAR | Status: AC
  Administered 2011-11-21: 0.5 mL via INTRAMUSCULAR
  Filled 2011-11-21: qty 0.5

## 2011-11-21 MED ORDER — OXYCODONE-ACETAMINOPHEN 5-325 MG PO TABS
1.0000 | ORAL_TABLET | Freq: Once | ORAL | Status: AC
  Administered 2011-11-21: 1 via ORAL
  Filled 2011-11-21: qty 1

## 2011-11-21 MED ORDER — OXYCODONE-ACETAMINOPHEN 5-325 MG PO TABS: 1.0000 | ORAL_TABLET | Freq: Four times a day (QID) | ORAL | Status: AC | PRN

## 2011-11-21 NOTE — Progress Notes (Signed)
Orthopedic Tech Progress Note Patient Details:  Jane Murphy November 04, 1955 [id number]  Ortho Devices Type of Ortho Device: Crutches;Knee Immobilizer Ortho Device/Splint Interventions: Application   [name], [name] 11/21/2011, 7:25 PM

## 2011-11-21 NOTE — ED Notes (Signed)
Pt fells down 2-3 steps last p.m. Sustaining multiple bruises and abrasions. Right knee swollen and bruised, right ankle abrasion but denies pain, pain 3rd & 4th right toes. Left knee w/ small abrasion. Pt is scheduled for left TKR on 12/04/11, and right knee is scheduled 3 mos later.

## 2011-11-21 NOTE — Discharge Instructions (Signed)
Be sure to read and understand instructions below prior to leaving the hospital. If your symptoms persist without any improvement in 1 week it is reccommended that you follow up with orthopedics listed above. Use your pain medication as prescribed and do not operate heavy machinery while on pain medication. Note that your pain medication contains acetaminophen (Tylenol) & its is not reccommended that you use additional acetaminophen (Tylenol) while taking this medication. ° °Knee Effusion  °The medical term for having fluid in your knee is effusion.This means something is wrong inside the knee. Some of the causes of fluid in the knee may be torn cartilage, a torn ligament, or bleeding into the joint from an injury. Small tears may heal on their own with conservative treatment. Conservative means rest, limited weight bearing activity and muscle strengthening exercises. Your recovery may take up to 6 weeks. Larger tears may require surgery.  ° °TREATMENT  °Rest, ice, elevation, and compression are the basic modes of treatment.   °Apply ice to the sore area for 15 to 20 minutes, 3 to 4 times per day. Do this while you are awake for the first 2 days, or as directed. This can be stopped when the swelling goes away. Put the ice in a plastic bag and place a towel between the bag of ice and your skin.  °Keep your leg elevated when possible to lessen swelling.  °If your caregiver recommends crutches, use them as instructed for 1 week. Then, you may walk as tolerated.  °Do not drive a vehicle on pain medication. °ACTIVITY: °           - Weight bearing as tolerated °           - Exercises should be limited to pain free range of motion ° °Knee Immobilization:: This is used to support and protect an injured or painful knee. Knee immobilizers keep your knee from being used while it is healing.  °Use powder to control irritation from sweat and friction.  °Adjust the immobilizer to be firm but not tight. Signs of an immobilizer  that is too tight include:  ° Swelling.  ° Numbness.  ° Color change in your foot or ankle.  ° Increased pain.  °While resting, raise your leg above the level of your heart. This reduces throbbing and helps healing. Prop it up with pillows.  °Remove the immobilizer to bathe and sleep. Wear it other times until you see your doctor again.  °             °SEEK MEDICAL CARE IF:  °You have an increase in bruising, swelling, or pain.  °Your toes feel cold.  °Pain relief is not achieved with medications.  °EMERGENCY:: Your toes are numb or blue or you have severe pain.  °You notice redness, swelling, warmth or increasing pain in your knee.  °An unexplained oral temperature above 102° F (38.9° C) develops. ° °COLD THERAPY DIRECTIONS:  °Ice or gel packs can be used to reduce both pain and swelling. Ice is the most helpful within the first 24 to 48 hours after an injury or flareup from overusing a muscle or joint.  Ice is effective, has very few side effects, and is safe for most people to use.  ° °If you expose your skin to cold temperatures for too long or without the proper protection, you can damage your skin or nerves. Watch for signs of skin damage due to cold.  ° °HOME CARE INSTRUCTIONS  °Follow these   tips to use ice and cold packs safely.  °Place a dry or damp towel between the ice and skin. A damp towel will cool the skin more quickly, so you may need to shorten the time that the ice is used.  °For a more rapid response, add gentle compression to the ice.  °Ice for no more than 10 to 20 minutes at a time. The bonier the area you are icing, the less time it will take to get the benefits of ice.  °Check your skin after 5 minutes to make sure there are no signs of a poor response to cold or skin damage.  °Rest 20 minutes or more in between uses.  °Once your skin is numb, you can end your treatment. You can test numbness by very lightly touching your skin. The touch should be so light that you do not see the skin dimple  from the pressure of your fingertip. When using ice, most people will feel these normal sensations in this order: cold, burning, aching, and numbness.  °Do not use ice on someone who cannot communicate their responses to pain, such as small children or people with dementia.  ° °HOW TO MAKE AN ICE PACK  °To make an ice pack, do one of the following:  °Place crushed ice or a bag of frozen vegetables in a sealable plastic bag. Squeeze out the excess air. Place this bag inside another plastic bag. Slide the bag into a pillowcase or place a damp towel between your skin and the bag.  °Mix 3 parts water with 1 part rubbing alcohol. Freeze the mixture in a sealable plastic bag. When you remove the mixture from the freezer, it will be slushy. Squeeze out the excess air. Place this bag inside another plastic bag. Slide the bag into a pillowcase or place a damp towel between your s ° ° ° ° ° °

## 2011-11-21 NOTE — ED Notes (Signed)
Ice pack provided, ortho called for knee immobilizer and crutches

## 2011-11-22 NOTE — ED Provider Notes (Signed)
History     CSN: [id number]  Arrival date & time 11/21/11  1716   First MD Initiated Contact with Patient 11/21/11 1748      Chief Complaint  Patient presents with  . Fall    (Consider location/radiation/quality/duration/timing/severity/associated sxs/prior treatment) Patient is a 56 y.o. female presenting with fall. The history is provided by the patient.  Fall The accident occurred 3 to 5 hours ago. The fall occurred while walking (down 2-3 steps). She fell from a height of 3 to 5 ft. She landed on a hard floor. There was no blood loss. The point of impact was the right knee. The pain is present in the right knee. The pain is at a severity of 8/10. The pain is moderate. She was ambulatory at the scene (with limp & cane). There was no entrapment after the fall. There was no drug use involved in the accident. There was no alcohol use involved in the accident. Pertinent negatives include no visual change, no fever, no numbness, no abdominal pain, no bowel incontinence, no nausea, no vomiting, no hematuria, no headaches, no hearing loss, no loss of consciousness and no tingling. The symptoms are aggravated by activity, standing, flexion, pressure on the injury, use of the injured limb, ambulation, extension and rotation. She has tried ice for the symptoms. The treatment provided no relief.    Past Medical History  Diagnosis Date  . Arthritis   . Osteoarthritis   . Hypertension   . PTSD (post-traumatic stress disorder)   . Shingles   . Rectocele     Past Surgical History  Procedure Date  . C section   . Oophorectomy   . Hernia repair   . Tonsillectomy   . Appendectomy   . Cholecystectomy     No family history on file.  History  Substance Use Topics  . Smoking status: Never Smoker   . Smokeless tobacco: Never Used  . Alcohol Use: 2.4 oz/week    4 Cans of beer per week     Occasional     OB History    Grav Para Term Preterm Abortions TAB SAB Ect Mult Living        Review of Systems  Constitutional: Negative for fever, chills and appetite change.  HENT: Negative for congestion.   Eyes: Negative for visual disturbance.  Respiratory: Negative for shortness of breath.   Cardiovascular: Negative for chest pain and leg swelling.  Gastrointestinal: Negative for nausea, vomiting, abdominal pain and bowel incontinence.  Genitourinary: Negative for dysuria, urgency, frequency and hematuria.  Musculoskeletal: Positive for joint swelling, arthralgias and gait problem.  Neurological: Negative for dizziness, tingling, loss of consciousness, syncope, weakness, light-headedness, numbness and headaches.  Psychiatric/Behavioral: Negative for confusion.  All other systems reviewed and are negative.    Allergies  Aspirin; Nsaids; Other; and Food  Home Medications   Current Outpatient Rx  Name Route Sig Dispense Refill  . ACETAMINOPHEN 500 MG PO TABS Oral Take 1,000 mg by mouth every 6 (six) hours as needed. For pain.    Marland Kitchen VITAMIN D 1000 UNITS PO TABS Oral Take 1,000 Units by mouth once a week. Takes every Saturdays    . LOSARTAN POTASSIUM 100 MG PO TABS Oral Take 50 mg by mouth daily.    . OXYCODONE-ACETAMINOPHEN 5-325 MG PO TABS Oral Take 1 tablet by mouth every 6 (six) hours as needed for pain. 15 tablet 0  . SERTRALINE HCL 50 MG PO TABS Oral Take 100 mg by mouth daily.  BP 156/89  Pulse 103  Temp 99.4 F (37.4 C) (Oral)  Resp 18  SpO2 99%  Physical Exam  Nursing note and vitals reviewed. Constitutional: She is oriented to person, place, and time. She appears well-developed and well-nourished. No distress.  HENT:  Head: Normocephalic and atraumatic.  Eyes: Conjunctivae and EOM are normal.  Neck: Normal range of motion.  Cardiovascular:       Cap refill < 3 sec, intact distal pulses  Pulmonary/Chest: Effort normal.  Musculoskeletal: Normal range of motion.       ttp over right knee. Pain with active and passive flexion & extension of  right knee.   Neurological: She is alert and oriented to person, place, and time.  Skin: Skin is warm and dry. No rash noted. She is not diaphoretic.       Abrasions & bruising over right knee, mild swelling. No bleeding. Small abrasions noted on right foot.   Psychiatric: She has a normal mood and affect. Her behavior is normal.    ED Course  Procedures (including critical care time)  Labs Reviewed - No data to display Dg Knee Complete 4 Views Right  11/21/2011  *RADIOLOGY REPORT*  Clinical Data: Fall with bruising and swelling about patella.  RIGHT KNEE - COMPLETE 4+ VIEW  Comparison: None.  Findings: Prepatellar soft tissue swelling.  Enthesopathic change at the quadriceps insertion. No acute fracture or dislocation.  No joint effusion.  Minimal medial compartment osteoarthritis.  IMPRESSION: Prepatellar soft tissue swelling, without acute osseous abnormality.  Original Report Authenticated By: Consuello Bossier, M.D.   Dg Foot Complete Right  11/21/2011  *RADIOLOGY REPORT*  Clinical Data: Fall, right foot pain.  RIGHT FOOT COMPLETE - 3+ VIEW  Comparison: 10/22/2004  Findings: Plantar and posterior calcaneal spur.  Well corticated bone densities adjacent to the cuboid, likely secondary ossification centers. No acute bony abnormality.  Specifically, no fracture, subluxation, or dislocation.  Soft tissues are intact.  IMPRESSION: No acute bony abnormality.  Original Report Authenticated By: Cyndie Chime, M.D.     1. Fall   2. Knee pain   3. Ankle pain       MDM  Knee pain s/p fall  Patient X-Ray negative for obvious fracture or dislocation. Pain managed in ED. Pt advised to follow up with orthopedics if symptoms persist for possibility of missed fracture diagnosis. Patient given brace while in ED, conservative therapy recommended and discussed. Patient will be dc home & is agreeable with above plan.         [name], [place] 11/22/11 0206

## 2011-11-23 NOTE — ED Provider Notes (Signed)
Medical screening examination/treatment/procedure(s) were performed by non-physician practitioner and as supervising physician I was immediately available for consultation/collaboration.   Gavin Pound. Theola Cuellar, MD 11/23/11 1557

## 2012-06-26 ENCOUNTER — Encounter (HOSPITAL_COMMUNITY): Payer: Self-pay | Admitting: *Deleted

## 2012-06-26 ENCOUNTER — Emergency Department (HOSPITAL_COMMUNITY)
Admission: EM | Admit: 2012-06-26 | Discharge: 2012-06-26 | Disposition: A | Discharge status: Home / Self Care | Payer: Self-pay | Attending: Emergency Medicine | Admitting: Emergency Medicine

## 2012-06-26 DIAGNOSIS — Z8739 Personal history of other diseases of the musculoskeletal system and connective tissue: Secondary | ICD-10-CM | POA: Insufficient documentation

## 2012-06-26 DIAGNOSIS — R21 Rash and other nonspecific skin eruption: Secondary | ICD-10-CM | POA: Insufficient documentation

## 2012-06-26 DIAGNOSIS — I10 Essential (primary) hypertension: Secondary | ICD-10-CM | POA: Insufficient documentation

## 2012-06-26 DIAGNOSIS — B029 Zoster without complications: Secondary | ICD-10-CM | POA: Insufficient documentation

## 2012-06-26 DIAGNOSIS — Z8659 Personal history of other mental and behavioral disorders: Secondary | ICD-10-CM | POA: Insufficient documentation

## 2012-06-26 DIAGNOSIS — Z79899 Other long term (current) drug therapy: Secondary | ICD-10-CM | POA: Insufficient documentation

## 2012-06-26 MED ORDER — OXYCODONE-ACETAMINOPHEN 10-325 MG PO TABS: 1.0000 | ORAL_TABLET | ORAL | Status: DC | PRN

## 2012-06-26 MED ORDER — VALACYCLOVIR HCL 1 G PO TABS: 1000.0000 mg | ORAL_TABLET | Freq: Three times a day (TID) | ORAL | Status: AC

## 2012-06-26 MED ORDER — ERYTHROMYCIN 5 MG/GM OP OINT: TOPICAL_OINTMENT | OPHTHALMIC | Status: DC

## 2012-06-26 MED ORDER — OXYCODONE-ACETAMINOPHEN 5-325 MG PO TABS
2.0000 | ORAL_TABLET | Freq: Once | ORAL | Status: AC
  Administered 2012-06-26: 2 via ORAL
  Filled 2012-06-26: qty 2

## 2012-06-26 NOTE — ED Notes (Signed)
Pt has small red area over L eyebrow. Pt states area is painful and is the beginning of a shingles break out. Pt states her L eye is light sensitive. Lights dimmed for pt comfort. Pt rates pain 7/10. Pt with no acute distress.

## 2012-06-26 NOTE — ED Notes (Signed)
Pt states she has a ride home. 

## 2012-06-26 NOTE — ED Provider Notes (Signed)
History     CSN: [id number]  Arrival date & time 06/26/12  1435   First MD Initiated Contact with Patient 06/26/12 1529      Chief Complaint  Patient presents with  . Herpes Zoster    (Consider location/radiation/quality/duration/timing/severity/associated sxs/prior treatment) The history is provided by the patient.  Jane Murphy is a 57 y.o. female hx of HTN, shingles, recent knee replacement with rejection and just taken off of cyclosporin here presenting with possible shingles. 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 She was placed on acyclovir and Percocet and felt better. She had pain in the left facial area since last night and she fell it may be her shingles coming on. She also saw a mild rash on the left forehead so she came to be evaluated. Denies any rash on the nose or ears. She has some anterior chest in the left eye but denies any alert he vision or pain in the eye. Mild photophobia as well.    Past Medical History  Diagnosis Date  . Arthritis   . Osteoarthritis   . Hypertension   . PTSD (post-traumatic stress disorder)   . Shingles   . Rectocele     Past Surgical History  Procedure Laterality Date  . C section    . Oophorectomy    . Hernia repair    . Tonsillectomy    . Appendectomy    . Cholecystectomy      History reviewed. No pertinent family history.  History  Substance Use Topics  . Smoking status: Never Smoker   . Smokeless tobacco: Never Used  . Alcohol Use: 2.4 oz/week    4 Cans of beer per week     Comment: Occasional     OB History   Grav Para Term Preterm Abortions TAB SAB Ect Mult Living                  Review of Systems  Skin: Positive for rash.  All other systems reviewed and are negative.    Allergies  Aspirin; Nsaids; Other; and Food  Home Medications   Current Outpatient Rx  Name  Route  Sig  Dispense  Refill  . acetaminophen (TYLENOL) 500 MG tablet   Oral   Take 1,000 mg by  mouth every 6 (six) hours as needed. For pain.         Marland Kitchen losartan (COZAAR) 100 MG tablet   Oral   Take 50 mg by mouth daily.         . sertraline (ZOLOFT) 50 MG tablet   Oral   Take 100 mg by mouth daily.            BP 163/96  Pulse 98  Temp(Src) 97.7 F (36.5 C) (Oral)  Resp 20  SpO2 100%  Physical Exam  Nursing note and vitals reviewed. Constitutional: She is oriented to person, place, and time. She appears well-developed and well-nourished.  Slightly uncomfortable, mildly photophobic.   HENT:  Head: Normocephalic.    Right Ear: External ear normal.  Left Ear: External ear normal.  Mouth/Throat: Oropharynx is clear and moist.  No rash on nose or ears. Small macular papular rash on L forehead. No obvious vesicles.   Eyes: Conjunctivae are normal. Pupils are equal, round, and reactive to light.  No conjunctivitis.   Neck: Normal range of motion. Neck supple.  Cardiovascular: Normal rate, regular rhythm and normal heart sounds.   Pulmonary/Chest: Effort normal and breath  sounds normal. No respiratory distress. She has no wheezes. She has no rales.  Abdominal: Soft. Bowel sounds are normal. She exhibits no distension. There is no tenderness. There is no rebound and no guarding.  Musculoskeletal: Normal range of motion. She exhibits no edema and no tenderness.  Neurological: She is alert and oriented to person, place, and time.  Skin: Skin is warm and dry. Rash noted.  See HEENT   Psychiatric: She has a normal mood and affect. Her behavior is normal. Judgment and thought content normal.    ED Course  Procedures (including critical care time)  Labs Reviewed - No data to display No results found.   No diagnosis found.    MDM  Jane Murphy is a 57 y.o. female here with likely early shingles given hx of shingles and recently finished a course of immunosuppressants. Will prescribe valtrex and percocet. Will also give erythromycin drops to L eye. She has an  ophthalmologist that she can f/u with.         Richardean Canal, MD 06/26/12 [phone]

## 2012-06-26 NOTE — ED Notes (Signed)
Pt has small area of redness to left forehead. Reports it is shingles and has hx of same. States "its in the beginning stages." c/o severe pain to face.

## 2012-08-17 ENCOUNTER — Encounter (HOSPITAL_COMMUNITY): Payer: Self-pay | Admitting: Emergency Medicine

## 2012-08-17 ENCOUNTER — Emergency Department (HOSPITAL_COMMUNITY)
Admission: EM | Admit: 2012-08-17 | Discharge: 2012-08-17 | Disposition: A | Discharge status: Home / Self Care | Payer: Non-veteran care | Attending: Emergency Medicine | Admitting: Emergency Medicine

## 2012-08-17 ENCOUNTER — Emergency Department (HOSPITAL_COMMUNITY): Payer: Non-veteran care

## 2012-08-17 DIAGNOSIS — Z888 Allergy status to other drugs, medicaments and biological substances status: Secondary | ICD-10-CM | POA: Insufficient documentation

## 2012-08-17 DIAGNOSIS — Z8619 Personal history of other infectious and parasitic diseases: Secondary | ICD-10-CM | POA: Insufficient documentation

## 2012-08-17 DIAGNOSIS — S6990XA Unspecified injury of unspecified wrist, hand and finger(s), initial encounter: Secondary | ICD-10-CM | POA: Insufficient documentation

## 2012-08-17 DIAGNOSIS — S59909A Unspecified injury of unspecified elbow, initial encounter: Secondary | ICD-10-CM | POA: Insufficient documentation

## 2012-08-17 DIAGNOSIS — S59902A Unspecified injury of left elbow, initial encounter: Secondary | ICD-10-CM

## 2012-08-17 DIAGNOSIS — Y9289 Other specified places as the place of occurrence of the external cause: Secondary | ICD-10-CM | POA: Insufficient documentation

## 2012-08-17 DIAGNOSIS — W010XXA Fall on same level from slipping, tripping and stumbling without subsequent striking against object, initial encounter: Secondary | ICD-10-CM | POA: Insufficient documentation

## 2012-08-17 DIAGNOSIS — I10 Essential (primary) hypertension: Secondary | ICD-10-CM | POA: Insufficient documentation

## 2012-08-17 DIAGNOSIS — Y939 Activity, unspecified: Secondary | ICD-10-CM | POA: Insufficient documentation

## 2012-08-17 DIAGNOSIS — Z87448 Personal history of other diseases of urinary system: Secondary | ICD-10-CM | POA: Insufficient documentation

## 2012-08-17 DIAGNOSIS — S59919A Unspecified injury of unspecified forearm, initial encounter: Secondary | ICD-10-CM | POA: Insufficient documentation

## 2012-08-17 DIAGNOSIS — Z8739 Personal history of other diseases of the musculoskeletal system and connective tissue: Secondary | ICD-10-CM | POA: Insufficient documentation

## 2012-08-17 DIAGNOSIS — Z79899 Other long term (current) drug therapy: Secondary | ICD-10-CM | POA: Insufficient documentation

## 2012-08-17 DIAGNOSIS — Z886 Allergy status to analgesic agent status: Secondary | ICD-10-CM | POA: Insufficient documentation

## 2012-08-17 MED ORDER — OXYCODONE-ACETAMINOPHEN 10-325 MG PO TABS: 1.0000 | ORAL_TABLET | ORAL | Status: DC | PRN

## 2012-08-17 NOTE — ED Provider Notes (Signed)
Medical screening examination/treatment/procedure(s) were performed by non-physician practitioner and as supervising physician I was immediately available for consultation/collaboration. Marek Nghiem, MD, FACEP   Lucero Auzenne L Salvador [place], MD 08/17/12 1530 

## 2012-08-17 NOTE — ED Notes (Signed)
Pt c/o L elbow injury x6 days.  Repots falling on elbow.  No head injury or LOC.

## 2012-08-17 NOTE — ED Provider Notes (Signed)
History     CSN: [id number]  Arrival date & time 08/17/12  1354   First MD Initiated Contact with Patient 08/17/12 1401      Chief Complaint  Patient presents with  . Elbow Injury    (Consider location/radiation/quality/duration/timing/severity/associated sxs/prior treatment) HPI  57 year old female with history of arthritis and history of osteoarthritis who injured her left elbow 5 days ago from a fall is here for evaluations of left elbow injury. Patient reports she had bilateral knee replacement 6 months ago. States her knee is healing appropriately however she was at a picnic 5 days ago, slipped, fell backward hitting her left elbow onto a rock suffers. She reported onset of sharp stabbing pain to the left elbow with associated skin tear and bruising. The swelling has decreased however she continues to endorse pain to her left elbow. Pain is worsening with palpation, and with movement especially when she pushes herself off the chair.  She has been taking Tylenol with some relief. She denies wrist, or shoulder pain. Denies any numbness or weakness.  Past Medical History  Diagnosis Date  . Arthritis   . Osteoarthritis   . Hypertension   . PTSD (post-traumatic stress disorder)   . Shingles   . Rectocele     Past Surgical History  Procedure Laterality Date  . C section    . Oophorectomy    . Hernia repair    . Tonsillectomy    . Appendectomy    . Cholecystectomy      No family history on file.  History  Substance Use Topics  . Smoking status: Never Smoker   . Smokeless tobacco: Never Used  . Alcohol Use: 2.4 oz/week    4 Cans of beer per week     Comment: Occasional     OB History   Grav Para Term Preterm Abortions TAB SAB Ect Mult Living                  Review of Systems  Constitutional: Negative for fever.  Musculoskeletal: Positive for arthralgias. Negative for gait problem.  Skin: Negative for rash.  Neurological: Negative for numbness.    Allergies   Aspirin; Nsaids; Other; and Food  Home Medications   Current Outpatient Rx  Name  Route  Sig  Dispense  Refill  . acetaminophen (TYLENOL) 500 MG tablet   Oral   Take 1,000 mg by mouth every 6 (six) hours as needed. For pain.         Marland Kitchen erythromycin ophthalmic ointment      Place a 1/2 inch ribbon of ointment into the lower eyelid three times a day for a week.   3.5 g   0   . losartan (COZAAR) 100 MG tablet   Oral   Take 50 mg by mouth daily.         Marland Kitchen oxyCODONE-acetaminophen (PERCOCET) 10-325 MG per tablet   Oral   Take 1 tablet by mouth every 4 (four) hours as needed for pain.   15 tablet   0   . sertraline (ZOLOFT) 50 MG tablet   Oral   Take 100 mg by mouth daily.            BP 159/101  Pulse 101  Temp(Src) 98.9 F (37.2 C) (Oral)  Resp 16  SpO2 100%  Physical Exam  Nursing note and vitals reviewed. Constitutional: She appears well-developed and well-nourished. No distress.  HENT:  Head: Normocephalic and atraumatic.  Eyes: Conjunctivae are normal.  Neck: Neck supple.  Musculoskeletal: She exhibits tenderness (Left elbow: Point tenderness to posterior elbow at olecranon without swelling, rash, or deformity noted. a small scab noted to dorsum of elbow.   normal ROM.  no pain to L shoulder or L wrist pain.). She exhibits no edema.  No significant midline spine tenderness, crepitus, or step-off.  Neurological: She is alert.  Skin: Skin is warm. No rash noted.  Psychiatric: She has a normal mood and affect.    ED Course  Procedures (including critical care time)  3:10 PM Xray shows no acute fx or dislocation.  Reassurance given.  RICE therapy.  Will prescribe short course of pain medication as well.  Pt will f/u with ortho as needed.   Labs Reviewed - No data to display Dg Elbow Complete Left  08/17/2012   *RADIOLOGY REPORT*  Clinical Data: Fall 1 week ago, elbow injury  LEFT ELBOW - COMPLETE 3+ VIEW  Comparison: None.  Findings: Four views of the  left elbow submitted.  No acute fracture or subluxation.  No posterior fat pad sign.  IMPRESSION: No acute fracture or subluxation.  No posterior fat pad sign.   Original Report Authenticated By: Natasha Mead, M.D.     1. Elbow injury, left, initial encounter       MDM  BP 159/101  Pulse 101  Temp(Src) 98.9 F (37.2 C) (Oral)  Resp 16  SpO2 100%  I have reviewed nursing notes and vital signs. I personally reviewed the imaging tests through PACS system  I reviewed available ER/hospitalization records thought the EMR         Fayrene Helper, [place] 08/17/12 1513

## 2012-12-29 ENCOUNTER — Encounter (HOSPITAL_COMMUNITY): Payer: Self-pay | Admitting: Emergency Medicine

## 2012-12-29 ENCOUNTER — Emergency Department (HOSPITAL_COMMUNITY)
Admission: EM | Admit: 2012-12-29 | Discharge: 2012-12-29 | Disposition: A | Discharge status: Home / Self Care | Payer: Non-veteran care | Attending: Emergency Medicine | Admitting: Emergency Medicine

## 2012-12-29 DIAGNOSIS — Z8742 Personal history of other diseases of the female genital tract: Secondary | ICD-10-CM | POA: Insufficient documentation

## 2012-12-29 DIAGNOSIS — F4310 Post-traumatic stress disorder, unspecified: Secondary | ICD-10-CM | POA: Insufficient documentation

## 2012-12-29 DIAGNOSIS — I10 Essential (primary) hypertension: Secondary | ICD-10-CM | POA: Insufficient documentation

## 2012-12-29 DIAGNOSIS — B029 Zoster without complications: Secondary | ICD-10-CM | POA: Insufficient documentation

## 2012-12-29 DIAGNOSIS — Z8739 Personal history of other diseases of the musculoskeletal system and connective tissue: Secondary | ICD-10-CM | POA: Insufficient documentation

## 2012-12-29 DIAGNOSIS — Z79899 Other long term (current) drug therapy: Secondary | ICD-10-CM | POA: Insufficient documentation

## 2012-12-29 MED ORDER — FLUORESCEIN SODIUM 1 MG OP STRP
1.0000 | ORAL_STRIP | Freq: Once | OPHTHALMIC | Status: AC
  Administered 2012-12-29: 1 via OPHTHALMIC
  Filled 2012-12-29: qty 1

## 2012-12-29 MED ORDER — VALACYCLOVIR HCL 1 G PO TABS: 1000.0000 mg | ORAL_TABLET | Freq: Three times a day (TID) | ORAL | Status: AC

## 2012-12-29 MED ORDER — PREDNISONE 20 MG PO TABS
60.0000 mg | ORAL_TABLET | Freq: Once | ORAL | Status: AC
  Administered 2012-12-29: 60 mg via ORAL
  Filled 2012-12-29: qty 3

## 2012-12-29 MED ORDER — OXYCODONE-ACETAMINOPHEN 10-325 MG PO TABS: 1.0000 | ORAL_TABLET | Freq: Four times a day (QID) | ORAL | Status: DC | PRN

## 2012-12-29 MED ORDER — OXYCODONE-ACETAMINOPHEN 5-325 MG PO TABS
2.0000 | ORAL_TABLET | Freq: Once | ORAL | Status: AC
  Administered 2012-12-29: 2 via ORAL
  Filled 2012-12-29: qty 2

## 2012-12-29 MED ORDER — VALACYCLOVIR HCL 500 MG PO TABS
1000.0000 mg | ORAL_TABLET | Freq: Once | ORAL | Status: AC
  Administered 2012-12-29: 1000 mg via ORAL
  Filled 2012-12-29: qty 2

## 2012-12-29 NOTE — ED Provider Notes (Signed)
CSN: [id number]     Arrival date & time 12/29/12  1191 History   First MD Initiated Contact with Patient 12/29/12 [phone]     Chief Complaint  Patient presents with  . Herpes Zoster   HPI  History provided by the patient. The patient is a 57 year old female with history of hypertension and herpes zoster presenting with complaints of return of herpes zoster and pain to left for head and face. Patient states that she has had recurrent shingles to her face at least one to 2 times a year for the past few years. She reports recently several members in the household including himself have had some URI symptoms which she believes may have set off the shingles. She has recovered from most of these symptoms but does have occasional cough.  She denies congestion, sore throat, fever or chills. Her pain and rash to the face started yesterday and has been worsening. No other aggravating or alleviating factors. No other associated symptoms.    Past Medical History  Diagnosis Date  . Arthritis   . Osteoarthritis   . Hypertension   . PTSD (post-traumatic stress disorder)   . Shingles   . Rectocele    Past Surgical History  Procedure Laterality Date  . C section    . Oophorectomy    . Hernia repair    . Tonsillectomy    . Appendectomy    . Cholecystectomy    . Knee surgery    . Spinal fusion     No family history on file. History  Substance Use Topics  . Smoking status: Never Smoker   . Smokeless tobacco: Never Used  . Alcohol Use: 2.4 oz/week    4 Cans of beer per week     Comment: Occasional    OB History   Grav Para Term Preterm Abortions TAB SAB Ect Mult Living                 Review of Systems  Constitutional: Negative for fever, chills and diaphoresis.  Eyes: Negative for pain, redness and visual disturbance.  Respiratory: Negative for cough.   Skin: Positive for rash.  All other systems reviewed and are negative.    Allergies  Aspirin; Nsaids; Other; and Food  Home  Medications   Current Outpatient Rx  Name  Route  Sig  Dispense  Refill  . cholecalciferol (VITAMIN D) 400 UNITS TABS tablet   Oral   Take 400 Units by mouth daily.         Marland Kitchen losartan (COZAAR) 100 MG tablet   Oral   Take 100 mg by mouth daily.          . sertraline (ZOLOFT) 50 MG tablet   Oral   Take 50 mg by mouth daily.          Marland Kitchen acetaminophen (TYLENOL) 500 MG tablet   Oral   Take 1,000 mg by mouth every 6 (six) hours as needed. For pain.          BP 165/100  Pulse 71  Temp(Src) 99 F (37.2 C) (Oral)  Resp 16  Ht 5\' 6"  (1.676 m)  Wt 237 lb (107.502 kg)  BMI 38.27 kg/m2  SpO2 96% Physical Exam  Nursing note and vitals reviewed. Constitutional: She is oriented to person, place, and time. She appears well-developed and well-nourished. No distress.  HENT:  Head: Normocephalic.  Mouth/Throat: Oropharynx is clear and moist.  Eyes: Conjunctivae and EOM are normal. Pupils are equal, round, and  reactive to light.  Slit lamp exam:      The left eye shows no fluorescein uptake.  No dendritic branching  Cardiovascular: Normal rate and regular rhythm.   Pulmonary/Chest: Effort normal and breath sounds normal. No respiratory distress. She has no wheezes. She has no rales.  Abdominal: Soft.  Neurological: She is alert and oriented to person, place, and time.  Skin: Skin is warm and dry. No rash noted.  Areas of erythema over the left for head and near the eyebrow. No vesicles or blistering at this time  Psychiatric: She has a normal mood and affect. Her behavior is normal.    ED Course  Procedures      MDM   1. Herpes zoster      Patient seen and evaluated. Patient is well-appearing in no acute distress. There is some redness and dryness of the skin to the left for head and eyebrow area. No vesicles or blistering at this time. Other concerning findings.    Angus Seller, PA-C 12/29/12 0425

## 2012-12-29 NOTE — ED Provider Notes (Signed)
Medical screening examination/treatment/procedure(s) were performed by non-physician practitioner and as supervising physician I was immediately available for consultation/collaboration.  Raeford Razor, MD 12/29/12 [phone]

## 2012-12-29 NOTE — ED Notes (Signed)
Pt c/o shingles outbreak to L forehead, area red, swollen.

## 2013-01-31 ENCOUNTER — Emergency Department (HOSPITAL_COMMUNITY)
Admission: EM | Admit: 2013-01-31 | Discharge: 2013-01-31 | Disposition: A | Discharge status: Home / Self Care | Payer: Non-veteran care | Attending: Emergency Medicine | Admitting: Emergency Medicine

## 2013-01-31 ENCOUNTER — Encounter (HOSPITAL_COMMUNITY): Payer: Self-pay | Admitting: Emergency Medicine

## 2013-01-31 DIAGNOSIS — Z79899 Other long term (current) drug therapy: Secondary | ICD-10-CM | POA: Insufficient documentation

## 2013-01-31 DIAGNOSIS — G8911 Acute pain due to trauma: Secondary | ICD-10-CM | POA: Insufficient documentation

## 2013-01-31 DIAGNOSIS — F4310 Post-traumatic stress disorder, unspecified: Secondary | ICD-10-CM | POA: Insufficient documentation

## 2013-01-31 DIAGNOSIS — Z9889 Other specified postprocedural states: Secondary | ICD-10-CM | POA: Insufficient documentation

## 2013-01-31 DIAGNOSIS — IMO0002 Reserved for concepts with insufficient information to code with codable children: Secondary | ICD-10-CM | POA: Insufficient documentation

## 2013-01-31 DIAGNOSIS — M5410 Radiculopathy, site unspecified: Secondary | ICD-10-CM

## 2013-01-31 DIAGNOSIS — M25569 Pain in unspecified knee: Secondary | ICD-10-CM | POA: Insufficient documentation

## 2013-01-31 DIAGNOSIS — Z8739 Personal history of other diseases of the musculoskeletal system and connective tissue: Secondary | ICD-10-CM | POA: Insufficient documentation

## 2013-01-31 DIAGNOSIS — Z8619 Personal history of other infectious and parasitic diseases: Secondary | ICD-10-CM | POA: Insufficient documentation

## 2013-01-31 DIAGNOSIS — Z96659 Presence of unspecified artificial knee joint: Secondary | ICD-10-CM | POA: Insufficient documentation

## 2013-01-31 DIAGNOSIS — Z8742 Personal history of other diseases of the female genital tract: Secondary | ICD-10-CM | POA: Insufficient documentation

## 2013-01-31 DIAGNOSIS — G8929 Other chronic pain: Secondary | ICD-10-CM | POA: Insufficient documentation

## 2013-01-31 DIAGNOSIS — I10 Essential (primary) hypertension: Secondary | ICD-10-CM | POA: Insufficient documentation

## 2013-01-31 DIAGNOSIS — M549 Dorsalgia, unspecified: Secondary | ICD-10-CM

## 2013-01-31 MED ORDER — METHYLPREDNISOLONE 4 MG PO KIT: PACK | ORAL | Status: DC

## 2013-01-31 MED ORDER — OXYCODONE-ACETAMINOPHEN 5-325 MG PO TABS: 2.0000 | ORAL_TABLET | ORAL | Status: DC | PRN

## 2013-01-31 MED ORDER — ONDANSETRON 4 MG PO TBDP
4.0000 mg | ORAL_TABLET | Freq: Once | ORAL | Status: AC
  Administered 2013-01-31: 4 mg via ORAL
  Filled 2013-01-31: qty 1

## 2013-01-31 MED ORDER — MORPHINE SULFATE 4 MG/ML IJ SOLN
8.0000 mg | Freq: Once | INTRAMUSCULAR | Status: AC
  Administered 2013-01-31: 8 mg via INTRAMUSCULAR
  Filled 2013-01-31: qty 2

## 2013-01-31 MED ORDER — DEXAMETHASONE SODIUM PHOSPHATE 10 MG/ML IJ SOLN
10.0000 mg | Freq: Once | INTRAMUSCULAR | Status: AC
  Administered 2013-01-31: 10 mg via INTRAMUSCULAR

## 2013-01-31 MED ORDER — DEXAMETHASONE SODIUM PHOSPHATE 10 MG/ML IJ SOLN
10.0000 mg | Freq: Once | INTRAMUSCULAR | Status: DC
  Filled 2013-01-31: qty 1

## 2013-01-31 NOTE — ED Notes (Signed)
Pt states that she has an injury from being in [employer] where she injured her back and has bilat knee pain.  Pt was supposed to go to [place] the other night for testing but pt couldn't get there and having fecal incontinence due to back spasms. Pt states that this happens frequently.

## 2013-01-31 NOTE — ED Provider Notes (Signed)
CSN: [id number]     Arrival date & time 01/31/13  1339 History   First MD Initiated Contact with Patient 01/31/13 1522     Chief Complaint  Patient presents with  . Back Problem    spasms  . Back Pain  . Leg Pain    HPI  57 year old female for long history of back pain. She carries a lot of her pain to an injury that she sustained in the 70s in the [employer]. She is in chronic knee pain as well. History is that she had bilateral total knee arthroplasties. She states her knee are much improved, however she continues to have intermittent episodes of pain in her back. Her primary care is through the [place]. She was scheduled for the day. She does not drive. She states she normally relies on [facility]. She states that occasionally people for acute problems will come up and she will get "bumped". She lost her eye today. She's been having pain in her back for one weeks time and came here because she states she had no other options to go to.  She states that during a delivered a child in 1978 she had a grade for care. She had a repair in the delivery room. She talked rectocele. She's had 3 surgeries since that time for attempted repair including peroneal mesh x2. She states on good days he's with a lap for cough or walking up and down steps he has chronic urinary and fecal incontinence. It is no worse today. She states she normally has perineal numbness and this is no worse today. She has no pain below her knees. The pain she describes as left paraspinal and radiates to the lateral aspect of the medial aspect of her knee. She does describe the L3 or L4 pattern.  Past Medical History  Diagnosis Date  . Arthritis   . Osteoarthritis   . Hypertension   . PTSD (post-traumatic stress disorder)   . Shingles   . Rectocele    Past Surgical History  Procedure Laterality Date  . C section    . Oophorectomy    . Hernia repair    . Tonsillectomy    . Appendectomy    . Cholecystectomy    . Knee surgery     . Spinal fusion     No family history on file. History  Substance Use Topics  . Smoking status: Never Smoker   . Smokeless tobacco: Never Used  . Alcohol Use: 2.4 oz/week    4 Cans of beer per week     Comment: Occasional    OB History   Grav Para Term Preterm Abortions TAB SAB Ect Mult Living                 Review of Systems  Constitutional: Negative for fever, chills, diaphoresis, appetite change and fatigue.  HENT: Negative for mouth sores, sore throat and trouble swallowing.   Eyes: Negative for visual disturbance.  Respiratory: Negative for cough, chest tightness, shortness of breath and wheezing.   Cardiovascular: Negative for chest pain.  Gastrointestinal: Negative for nausea, vomiting, abdominal pain, diarrhea and abdominal distention.  Endocrine: Negative for polydipsia, polyphagia and polyuria.  Genitourinary: Negative for dysuria, frequency and hematuria.  Musculoskeletal: Positive for back pain and myalgias. Negative for gait problem.  Skin: Negative for color change, pallor and rash.  Neurological: Positive for numbness. Negative for dizziness, syncope, weakness, light-headedness and headaches.  Hematological: Does not bruise/bleed easily.  Psychiatric/Behavioral: Negative for behavioral problems  and confusion.    Allergies  Aspirin; Nsaids; Other; and Food  Home Medications   Current Outpatient Rx  Name  Route  Sig  Dispense  Refill  . cholecalciferol (VITAMIN D) 400 UNITS TABS tablet   Oral   Take 400 Units by mouth daily.         Marland Kitchen losartan (COZAAR) 100 MG tablet   Oral   Take 100 mg by mouth daily.          . sertraline (ZOLOFT) 50 MG tablet   Oral   Take 50 mg by mouth daily.          . methylPREDNISolone (MEDROL DOSEPAK) 4 MG tablet      6 qd on day 1, decrease by 1 tab per day for 6 days   21 tablet   0   . oxyCODONE-acetaminophen (PERCOCET/ROXICET) 5-325 MG per tablet   Oral   Take 2 tablets by mouth every 4 (four) hours as  needed for pain.   20 tablet   0    BP 143/90  Pulse 92  Temp(Src) 99 F (37.2 C) (Oral)  Resp 19  SpO2 96% Physical Exam  Constitutional: She is oriented to person, place, and time. She appears well-developed and well-nourished. No distress.  HENT:  Head: Normocephalic.  Eyes: Conjunctivae are normal. Pupils are equal, round, and reactive to light. No scleral icterus.  Neck: Normal range of motion. Neck supple. No thyromegaly present.  Cardiovascular: Normal rate and regular rhythm.  Exam reveals no gallop and no friction rub.   No murmur heard. Pulmonary/Chest: Effort normal and breath sounds normal. No respiratory distress. She has no wheezes. She has no rales.  Abdominal: Soft. Bowel sounds are normal. She exhibits no distension. There is no tenderness. There is no rebound.  Musculoskeletal: Normal range of motion.  To palpate the paraspinal muscles from the left. She does try her hand along L3 pattern to her medial knee  Neurological: She is alert and oriented to person, place, and time.  She has normal dorsiflexion plantar flexion of the great toe and ankle. She can squat and stand. She is somewhat limited with her squatting due to some chronic knee pain and stiffness. No asymmetry to her exam. Parineal and rectal exam not done  Skin: Skin is warm and dry. No rash noted.  Psychiatric: She has a normal mood and affect. Her behavior is normal.    ED Course  Procedures (including critical care time) Labs Review Labs Reviewed - No data to display Imaging Review No results found.  EKG Interpretation   None       MDM   1. Back pain   2. Radiculopathy    Discussion this is an acute exacerbation chronic back pain. I don't feel this is an acute herniated nucleus. She may indeed have L3 or radiculopathy. I think she does need MRI imaging because of her chronic symptoms  that mimic acute herniated nucleus. I do not feel based on her description of the chronicity of her  symptoms that this is an acutely herniated disc today.    Roney Marion, MD 01/31/13 [phone]

## 2014-08-22 ENCOUNTER — Emergency Department (HOSPITAL_COMMUNITY)
Admission: EM | Admit: 2014-08-22 | Discharge: 2014-08-22 | Disposition: A | Discharge status: Home / Self Care | Payer: Non-veteran care | Attending: Emergency Medicine | Admitting: Emergency Medicine

## 2014-08-22 ENCOUNTER — Encounter (HOSPITAL_COMMUNITY): Payer: Self-pay

## 2014-08-22 DIAGNOSIS — B029 Zoster without complications: Secondary | ICD-10-CM | POA: Insufficient documentation

## 2014-08-22 DIAGNOSIS — R319 Hematuria, unspecified: Secondary | ICD-10-CM | POA: Diagnosis not present

## 2014-08-22 DIAGNOSIS — Z87448 Personal history of other diseases of urinary system: Secondary | ICD-10-CM | POA: Insufficient documentation

## 2014-08-22 DIAGNOSIS — Z79899 Other long term (current) drug therapy: Secondary | ICD-10-CM | POA: Diagnosis not present

## 2014-08-22 DIAGNOSIS — Z8739 Personal history of other diseases of the musculoskeletal system and connective tissue: Secondary | ICD-10-CM | POA: Diagnosis not present

## 2014-08-22 DIAGNOSIS — Z87891 Personal history of nicotine dependence: Secondary | ICD-10-CM | POA: Diagnosis not present

## 2014-08-22 DIAGNOSIS — R21 Rash and other nonspecific skin eruption: Secondary | ICD-10-CM | POA: Diagnosis present

## 2014-08-22 DIAGNOSIS — I10 Essential (primary) hypertension: Secondary | ICD-10-CM | POA: Insufficient documentation

## 2014-08-22 DIAGNOSIS — Z8659 Personal history of other mental and behavioral disorders: Secondary | ICD-10-CM | POA: Diagnosis not present

## 2014-08-22 LAB — URINALYSIS, ROUTINE W REFLEX MICROSCOPIC
BILIRUBIN URINE: NEGATIVE
GLUCOSE, UA: NEGATIVE mg/dL
Hgb urine dipstick: NEGATIVE
KETONES UR: NEGATIVE mg/dL
Leukocytes, UA: NEGATIVE
NITRITE: NEGATIVE
Protein, ur: NEGATIVE mg/dL
Specific Gravity, Urine: 1.022 (ref 1.005–1.030)
Urobilinogen, UA: 0.2 mg/dL (ref 0.0–1.0)
pH: 7 (ref 5.0–8.0)

## 2014-08-22 MED ORDER — OXYCODONE-ACETAMINOPHEN 5-325 MG PO TABS: 1.0000 | ORAL_TABLET | ORAL | Status: DC | PRN

## 2014-08-22 MED ORDER — MORPHINE SULFATE 4 MG/ML IJ SOLN
8.0000 mg | Freq: Once | INTRAMUSCULAR | Status: AC
  Administered 2014-08-22: 8 mg via INTRAMUSCULAR
  Filled 2014-08-22: qty 2

## 2014-08-22 MED ORDER — VALACYCLOVIR HCL 1 G PO TABS: 1000.0000 mg | ORAL_TABLET | Freq: Three times a day (TID) | ORAL | Status: AC

## 2014-08-22 MED ORDER — FLUORESCEIN SODIUM 1 MG OP STRP
1.0000 | ORAL_STRIP | Freq: Once | OPHTHALMIC | Status: AC
  Administered 2014-08-22: 1 via OPHTHALMIC
  Filled 2014-08-22: qty 1

## 2014-08-22 NOTE — Discharge Instructions (Signed)

## 2014-08-22 NOTE — ED Notes (Signed)
Bed: [id number] Expected date:  Expected time:  Means of arrival:  Comments: Hold for triage- Eulogio BearMcCarron

## 2014-08-22 NOTE — ED Provider Notes (Signed)
CSN: [id number]     Arrival date & time 08/22/14  1328 History   First MD Initiated Contact with Patient 08/22/14 1455     Chief Complaint  Patient presents with  . Herpes Zoster  . Hematuria  . Rash    Patient is a 59 y.o. female presenting with hematuria and rash. The history is provided by the patient. No language interpreter was used.  Hematuria  Rash  Ms. Trice presents for shingles and vaginal pain.  In terms of shingles she has a hx/o recurrent V1 distribution shingles, last episode was several years ago.  She developed pain behind her left eye starting last night with painful bumps above her left eyebrow. She denies any history of ophthalmic involvement, no vision changes, no eye pain.  She denies fevers, vomiting, diarrhea.  She is also having vaginal/labial burning pain, blood on the tissue when she wipes.  She did use different toilet paper a few days ago and has a hx/o reaction to some papers.  She has a hx/o urinary and fecal incontinence as well as rectovaginal fistula.  Sxs are moderate, constant, worsening.     Past Medical History  Diagnosis Date  . Arthritis   . Osteoarthritis   . Hypertension   . PTSD (post-traumatic stress disorder)   . Shingles   . Rectocele    Past Surgical History  Procedure Laterality Date  . C section    . Oophorectomy    . Hernia repair    . Tonsillectomy    . Appendectomy    . Cholecystectomy    . Knee surgery    . Spinal fusion     History reviewed. No pertinent family history. History  Substance Use Topics  . Smoking status: Former Games developermoker  . Smokeless tobacco: Never Used  . Alcohol Use: 2.4 oz/week    4 Cans of beer per week     Comment: Occasional    OB History    No data available     Review of Systems  Genitourinary: Positive for hematuria.  Skin: Positive for rash.  All other systems reviewed and are negative.     Allergies  Aspirin; Nsaids; Other; and Food  Home Medications   Prior to Admission  medications   Medication Sig Start Date End Date Taking? Authorizing Provider  Cholecalciferol (VITAMIN D) 2000 UNITS tablet Take 2,000 Units by mouth daily.   Yes Historical Provider, MD  loratadine (CLARITIN) 10 MG tablet Take 10 mg by mouth daily.   Yes Historical Provider, MD  losartan (COZAAR) 100 MG tablet Take 100 mg by mouth daily.    Yes Historical Provider, MD  metFORMIN (GLUCOPHAGE) 500 MG tablet Take 500 mg by mouth 2 (two) times daily with a meal.   Yes Historical Provider, MD  sertraline (ZOLOFT) 100 MG tablet Take 100 mg by mouth daily.   Yes Historical Provider, MD  methylPREDNISolone (MEDROL DOSEPAK) 4 MG tablet 6 qd on day 1, decrease by 1 tab per day for 6 days Patient not taking: Reported on 08/22/2014 01/31/13   Rolland PorterMark James, MD  oxyCODONE-acetaminophen (PERCOCET/ROXICET) 5-325 MG per tablet Take 2 tablets by mouth every 4 (four) hours as needed for pain. Patient not taking: Reported on 08/22/2014 01/31/13   Rolland PorterMark James, MD   BP 158/93 mmHg  Pulse 82  Temp(Src) 98.1 F (36.7 C) (Oral)  Resp 16  SpO2 99% Physical Exam  Constitutional: She is oriented to person, place, and time. She appears well-developed and well-nourished.  HENT:  Head: Normocephalic and atraumatic.  Few scattered vesicles with local erythema on left forehead, tip of nose  Eyes: Conjunctivae are normal. Pupils are equal, round, and reactive to light.  No uptake on fluorescein exam  Cardiovascular: Normal rate and regular rhythm.   No murmur heard. Pulmonary/Chest: Effort normal and breath sounds normal. No respiratory distress.  Abdominal: Soft. There is no tenderness. There is no rebound and no guarding.  Genitourinary:  External vaginal exam with labial erythema, no excoriations or rashes, no active bleeding.  Musculoskeletal: She exhibits no edema or tenderness.  Neurological: She is alert and oriented to person, place, and time.  Skin: Skin is warm and dry.  Psychiatric: She has a normal mood and  affect. Her behavior is normal.  Nursing note and vitals reviewed.   ED Course  Procedures (including critical care time) Labs Review Labs Reviewed  URINE CULTURE  URINALYSIS, ROUTINE W REFLEX MICROSCOPIC    Imaging Review No results found.   EKG Interpretation None      MDM   Final diagnoses:  Shingles outbreak   Pt here for facial pain, vaginal pain - blood when wiping.  In terms of facial pain - pt with shingles, V1 distribution no evidence of ophthalmologic involvement.  Discussed ophtho follow up, treating with valtrex.  Discussed risks/benefits of corticosteroids - will defer at this time.  Treating pain with percocet.  In terms of vaginal pain/blood with wiping - no external lesions, no current bleeding, no evidence of hematuria or UTI.  Presentation not c/w massive GI bleed or renal colic.  offered pt reassurance, discussed pcp follow up.      Tilden FossaElizabeth Reice Bienvenue, MD 08/22/14 1606

## 2014-08-22 NOTE — ED Notes (Signed)
Pt c/o headache and shingles outbreak x 1 days and hematuria and vaginal rash starting this morning.  Pain score 8/10.  Hx of shingles on face and reports "it feels the same."  Pt reports being incontinent of urine and stool.  Sts "the pad was red and I noticed a rash down there when I was cleaning."

## 2014-08-23 LAB — URINE CULTURE
COLONY COUNT: NO GROWTH
CULTURE: NO GROWTH

## 2014-10-05 DIAGNOSIS — I10 Essential (primary) hypertension: Secondary | ICD-10-CM | POA: Insufficient documentation

## 2015-07-21 ENCOUNTER — Emergency Department (HOSPITAL_COMMUNITY)
Admission: EM | Admit: 2015-07-21 | Discharge: 2015-07-21 | Disposition: A | Discharge status: Home / Self Care | Payer: Non-veteran care | Attending: Emergency Medicine | Admitting: Emergency Medicine

## 2015-07-21 ENCOUNTER — Encounter (HOSPITAL_COMMUNITY): Payer: Self-pay | Admitting: *Deleted

## 2015-07-21 DIAGNOSIS — Z79899 Other long term (current) drug therapy: Secondary | ICD-10-CM | POA: Diagnosis not present

## 2015-07-21 DIAGNOSIS — Z8659 Personal history of other mental and behavioral disorders: Secondary | ICD-10-CM | POA: Diagnosis not present

## 2015-07-21 DIAGNOSIS — M1990 Unspecified osteoarthritis, unspecified site: Secondary | ICD-10-CM | POA: Diagnosis not present

## 2015-07-21 DIAGNOSIS — Z87448 Personal history of other diseases of urinary system: Secondary | ICD-10-CM | POA: Diagnosis not present

## 2015-07-21 DIAGNOSIS — B029 Zoster without complications: Secondary | ICD-10-CM | POA: Insufficient documentation

## 2015-07-21 DIAGNOSIS — I10 Essential (primary) hypertension: Secondary | ICD-10-CM | POA: Diagnosis not present

## 2015-07-21 DIAGNOSIS — H53149 Visual discomfort, unspecified: Secondary | ICD-10-CM | POA: Diagnosis not present

## 2015-07-21 DIAGNOSIS — R21 Rash and other nonspecific skin eruption: Secondary | ICD-10-CM | POA: Diagnosis present

## 2015-07-21 DIAGNOSIS — Z87891 Personal history of nicotine dependence: Secondary | ICD-10-CM | POA: Insufficient documentation

## 2015-07-21 MED ORDER — ONDANSETRON HCL 4 MG/2ML IJ SOLN: 4.0000 mg | Freq: Once | INTRAMUSCULAR | Status: DC

## 2015-07-21 MED ORDER — PREDNISONE 20 MG PO TABS: 40.0000 mg | ORAL_TABLET | Freq: Every morning | ORAL | Status: DC

## 2015-07-21 MED ORDER — VALACYCLOVIR HCL 1 G PO TABS: 1000.0000 mg | ORAL_TABLET | Freq: Three times a day (TID) | ORAL | Status: AC

## 2015-07-21 MED ORDER — HYDROMORPHONE HCL 1 MG/ML IJ SOLN: 0.5000 mg | Freq: Once | INTRAMUSCULAR | Status: DC

## 2015-07-21 MED ORDER — OXYCODONE-ACETAMINOPHEN 5-325 MG PO TABS: 2.0000 | ORAL_TABLET | ORAL | Status: DC | PRN

## 2015-07-21 NOTE — Discharge Instructions (Signed)

## 2015-07-21 NOTE — ED Notes (Addendum)
Pt reports L side facial pain d/t shingles.  Pt [place], is a retired Engineer, civil (consulting)nurse and a Administrator, Civil Servicevet.  Reports  She has had shingles in the past.  States she had 2 vicodins left from prior so she took one today.  Ice pack given to pt for comfort per her request.  Pt also reports she has an appt with her eye doctor tomorrow.

## 2015-07-21 NOTE — ED Provider Notes (Signed)
CSN: [id number]     Arrival date & time 07/21/15  1447 History   First MD Initiated Contact with Patient 07/21/15 1609     Chief Complaint  Patient presents with  . Herpes Zoster   HPI Comments: 60 year old female who presents with a rash on the L side of her face since this morning. She states she has had shingles "12 times in 3 years". Reports associated HA, photophobia, FB sensation, tingling. Denies fever, chills. She has had Zostavax in the past and states it has given her Shingles before. She lives at the [place]. Has an appointment with an ophthamologist tomorrow. She took 2 Norco this morning.   Past Medical History  Diagnosis Date  . Arthritis   . Osteoarthritis   . Hypertension   . PTSD (post-traumatic stress disorder)   . Shingles   . Rectocele    Past Surgical History  Procedure Laterality Date  . C section    . Oophorectomy    . Hernia repair    . Tonsillectomy    . Appendectomy    . Cholecystectomy    . Knee surgery    . Spinal fusion     No family history on file. Social History  Substance Use Topics  . Smoking status: Former Games developer  . Smokeless tobacco: Never Used  . Alcohol Use: 2.4 oz/week    4 Cans of beer per week     Comment: Occasional    OB History    No data available     Review of Systems  Constitutional: Negative for fever and chills.  Eyes: Positive for photophobia. Negative for pain, discharge, redness, itching and visual disturbance.  Respiratory: Negative for shortness of breath.   Cardiovascular: Negative for chest pain.  Skin: Positive for rash.  Neurological: Positive for headaches.      Allergies  Aspirin; Nsaids; Other; and Food  Home Medications   Prior to Admission medications   Medication Sig Start Date End Date Taking? Authorizing Provider  Cholecalciferol (VITAMIN D) 2000 UNITS tablet Take 2,000 Units by mouth daily.   Yes Historical Provider, MD  HYDROcodone-acetaminophen (NORCO) 10-325 MG tablet Take 1  tablet by mouth every 6 (six) hours as needed for severe pain.   Yes Historical Provider, MD  losartan (COZAAR) 100 MG tablet Take 100 mg by mouth daily.    Yes Historical Provider, MD  methocarbamol (ROBAXIN) 750 MG tablet Take 750 mg by mouth at bedtime.   Yes Historical Provider, MD  sertraline (ZOLOFT) 100 MG tablet Take 100 mg by mouth daily.   Yes Historical Provider, MD  oxyCODONE-acetaminophen (PERCOCET/ROXICET) 5-325 MG tablet Take 2 tablets by mouth every 4 (four) hours as needed for severe pain. 07/21/15   Bethel Born, PA-C  predniSONE (DELTASONE) 20 MG tablet Take 2 tablets (40 mg total) by mouth every morning. 07/21/15   Bethel Born, PA-C  valACYclovir (VALTREX) 1000 MG tablet Take 1 tablet (1,000 mg total) by mouth 3 (three) times daily. 07/21/15 08/04/15  Bethel Born, PA-C   BP 149/87 mmHg  Pulse 81  Temp(Src) 98.3 F (36.8 C) (Oral)  Resp 16  SpO2 95%   Physical Exam  Constitutional: She is oriented to person, place, and time. She appears well-developed and well-nourished. No distress.  HENT:  Head: Normocephalic and atraumatic.  Eyes: Conjunctivae are normal. Pupils are equal, round, and reactive to light. Right eye exhibits no discharge. Left eye exhibits no discharge. No scleral icterus.  Neck: Normal  range of motion.  Cardiovascular: Normal rate and regular rhythm.  Exam reveals no gallop and no friction rub.   No murmur heard. Pulmonary/Chest: Effort normal and breath sounds normal. No respiratory distress. She has no wheezes. She has no rales. She exhibits no tenderness.  Neurological: She is alert and oriented to person, place, and time.  Mental Status:  Alert, oriented, thought content appropriate, able to give a coherent history. Speech fluent without evidence of aphasia. Able to follow 2 step commands without difficulty.  Cranial Nerves:  II:  Peripheral visual fields grossly normal, pupils equal, round, reactive to light III,IV, VI: ptosis not  present, extra-ocular motions intact bilaterally  V,VII: smile symmetric, facial light touch sensation equal VIII: hearing grossly normal to voice  X: uvula elevates symmetrically  XI: bilateral shoulder shrug symmetric and strong XII: midline tongue extension without fassiculations     Skin: Skin is warm and dry.  Single vesicle over L lateral eyebrow with surrounding erythema. No rash on back  Psychiatric: She has a normal mood and affect.    ED Course  Procedures (including critical care time) Labs Review Labs Reviewed - No data to display  Imaging Review No results found. I have personally reviewed and evaluated these images and lab results as part of my medical decision-making.   EKG Interpretation None      MDM   Final diagnoses:  Herpes zoster   60 year old female presents with a rash. Unsure if this is a early presentation of Zoster. Pt states she has had it in the past and it has presented similarly. Will treat with Valtrex, Prednisone, and Percocet for pain. She has a follow up with ophthalmology tomorrow. Pt is non-toxic, NAD, with stable VS. Patient / Family / Caregiver informed of clinical course, understand medical decision-making process, and agree with plan.  Shared visit with Dr. Janeece FittingNanavati     Donivan Thammavong Kipp BroodMarie Makaya Juneau, PA-C 07/21/15 1747  Derwood KaplanAnkit Nanavati, MD 07/22/15 [phone]

## 2017-03-25 ENCOUNTER — Encounter (HOSPITAL_COMMUNITY): Payer: Self-pay

## 2017-03-25 ENCOUNTER — Other Ambulatory Visit: Payer: Self-pay

## 2017-03-25 DIAGNOSIS — Z87891 Personal history of nicotine dependence: Secondary | ICD-10-CM | POA: Diagnosis not present

## 2017-03-25 DIAGNOSIS — Z9049 Acquired absence of other specified parts of digestive tract: Secondary | ICD-10-CM | POA: Diagnosis not present

## 2017-03-25 DIAGNOSIS — R1032 Left lower quadrant pain: Secondary | ICD-10-CM | POA: Diagnosis present

## 2017-03-25 DIAGNOSIS — Z79899 Other long term (current) drug therapy: Secondary | ICD-10-CM | POA: Insufficient documentation

## 2017-03-25 DIAGNOSIS — N200 Calculus of kidney: Secondary | ICD-10-CM | POA: Insufficient documentation

## 2017-03-25 DIAGNOSIS — I10 Essential (primary) hypertension: Secondary | ICD-10-CM | POA: Diagnosis not present

## 2017-03-25 DIAGNOSIS — F4310 Post-traumatic stress disorder, unspecified: Secondary | ICD-10-CM | POA: Diagnosis not present

## 2017-03-25 LAB — CBC WITH DIFFERENTIAL/PLATELET
Basophils Absolute: 0.1 10*3/uL (ref 0.0–0.1)
Basophils Relative: 1 %
EOS ABS: 0.3 10*3/uL (ref 0.0–0.7)
Eosinophils Relative: 2 %
HCT: 41.2 % (ref 36.0–46.0)
Hemoglobin: 13.3 g/dL (ref 12.0–15.0)
LYMPHS ABS: 3.4 10*3/uL (ref 0.7–4.0)
Lymphocytes Relative: 24 %
MCH: 29.2 pg (ref 26.0–34.0)
MCHC: 32.3 g/dL (ref 30.0–36.0)
MCV: 90.5 fL (ref 78.0–100.0)
MONOS PCT: 7 %
Monocytes Absolute: 1 10*3/uL (ref 0.1–1.0)
Neutro Abs: 9.5 10*3/uL — ABNORMAL HIGH (ref 1.7–7.7)
Neutrophils Relative %: 66 %
Platelets: 250 10*3/uL (ref 150–400)
RBC: 4.55 MIL/uL (ref 3.87–5.11)
RDW: 13.9 % (ref 11.5–15.5)
WBC: 14.3 10*3/uL — ABNORMAL HIGH (ref 4.0–10.5)

## 2017-03-25 LAB — BASIC METABOLIC PANEL
Anion gap: 8 (ref 5–15)
BUN: 18 mg/dL (ref 6–20)
CALCIUM: 9.4 mg/dL (ref 8.9–10.3)
CO2: 23 mmol/L (ref 22–32)
CREATININE: 1.32 mg/dL — AB (ref 0.44–1.00)
Chloride: 106 mmol/L (ref 101–111)
GFR calc Af Amer: 50 mL/min — ABNORMAL LOW (ref >60)
GFR calc non Af Amer: 43 mL/min — ABNORMAL LOW (ref >60)
Glucose, Bld: 126 mg/dL — ABNORMAL HIGH (ref 65–99)
Potassium: 4.3 mmol/L (ref 3.5–5.1)
SODIUM: 137 mmol/L (ref 135–145)

## 2017-03-25 NOTE — ED Triage Notes (Signed)
Pt presents with 2 day h/o L groin pain that radiates into L flank.  Pt endorses hematuria that began today; +nausea and diarrhea and chills

## 2017-03-26 ENCOUNTER — Emergency Department (HOSPITAL_COMMUNITY)
Admission: EM | Admit: 2017-03-26 | Discharge: 2017-03-26 | Disposition: A | Discharge status: Home / Self Care | Payer: Medicaid Other | Attending: Emergency Medicine | Admitting: Emergency Medicine

## 2017-03-26 ENCOUNTER — Emergency Department (HOSPITAL_COMMUNITY): Payer: Medicaid Other

## 2017-03-26 DIAGNOSIS — N200 Calculus of kidney: Secondary | ICD-10-CM

## 2017-03-26 LAB — URINALYSIS, ROUTINE W REFLEX MICROSCOPIC
Bilirubin Urine: NEGATIVE
Glucose, UA: NEGATIVE mg/dL
KETONES UR: NEGATIVE mg/dL
Leukocytes, UA: NEGATIVE
NITRITE: NEGATIVE
PH: 5 (ref 5.0–8.0)
Protein, ur: NEGATIVE mg/dL
Specific Gravity, Urine: >1.03 — ABNORMAL HIGH (ref 1.005–1.030)

## 2017-03-26 LAB — URINALYSIS, MICROSCOPIC (REFLEX)

## 2017-03-26 MED ORDER — ONDANSETRON HCL 4 MG/2ML IJ SOLN
4.0000 mg | Freq: Once | INTRAMUSCULAR | Status: AC
Start: 2017-03-26 — End: 2017-03-26
  Administered 2017-03-26: 4 mg via INTRAVENOUS
  Filled 2017-03-26: qty 2

## 2017-03-26 MED ORDER — ONDANSETRON 4 MG PO TBDP: 4.0000 mg | ORAL_TABLET | Freq: Three times a day (TID) | ORAL | 0 refills | Status: DC | PRN

## 2017-03-26 MED ORDER — OXYCODONE-ACETAMINOPHEN 5-325 MG PO TABS: 2.0000 | ORAL_TABLET | Freq: Four times a day (QID) | ORAL | 0 refills | Status: DC | PRN

## 2017-03-26 MED ORDER — DIAZEPAM 5 MG/ML IJ SOLN
5.0000 mg | Freq: Once | INTRAMUSCULAR | Status: AC
  Administered 2017-03-26: 5 mg via INTRAVENOUS
  Filled 2017-03-26: qty 2

## 2017-03-26 MED ORDER — OXYCODONE-ACETAMINOPHEN 5-325 MG PO TABS
2.0000 | ORAL_TABLET | Freq: Once | ORAL | Status: AC
  Administered 2017-03-26: 2 via ORAL
  Filled 2017-03-26: qty 2

## 2017-03-26 MED ORDER — HYDROMORPHONE HCL 1 MG/ML IJ SOLN
1.0000 mg | Freq: Once | INTRAMUSCULAR | Status: AC
  Administered 2017-03-26: 1 mg via INTRAVENOUS
  Filled 2017-03-26: qty 1

## 2017-03-26 MED ORDER — PHENAZOPYRIDINE HCL 95 MG PO TABS: 95.0000 mg | ORAL_TABLET | Freq: Three times a day (TID) | ORAL | 0 refills | Status: DC | PRN

## 2017-03-26 NOTE — ED Notes (Signed)
Patient transported to CT 

## 2017-03-26 NOTE — ED Notes (Signed)
Pt departed in NAD, refused use of wheelchair.  

## 2017-03-26 NOTE — Discharge Instructions (Signed)
Please follow-up with the urologist group listed if you do not pass the stone in 3 days.

## 2017-03-26 NOTE — ED Provider Notes (Signed)
[facility]   CSN: [id number] Arrival date & time: 03/25/17  2214     History   Chief Complaint Chief Complaint  Patient presents with  . Flank Pain    HPI Jane Murphy is a 61 y.o. female.  Patient presents to the emergency department with chief complaint of left flank pain.  She states the symptoms started 2 days ago.  She states that the pain is severe.  She reports that it comes in waves.  She reports associated dysuria and hematuria.  She also endorses nausea and diarrhea.  She denies any fever.  Denies any vomiting.  She denies any history of kidney stones, but states that she does have Sjogren's syndrome, and has had salivary stones and is concerned about having a kidney stone.   The history is provided by the patient. No language interpreter was used.    Past Medical History:  Diagnosis Date  . Arthritis   . Hypertension   . Osteoarthritis   . PTSD (post-traumatic stress disorder)   . Rectocele   . Shingles     There are no active problems to display for this patient.   Past Surgical History:  Procedure Laterality Date  . APPENDECTOMY    . C section    . CHOLECYSTECTOMY    . HERNIA REPAIR    . KNEE SURGERY    . OOPHORECTOMY    . SPINAL FUSION    . TONSILLECTOMY      OB History    No data available       Home Medications    Prior to Admission medications   Medication Sig Start Date End Date Taking? Authorizing Provider  Cholecalciferol (VITAMIN D) 2000 UNITS tablet Take 2,000 Units by mouth daily.    [provider]  HYDROcodone-acetaminophen (NORCO) 10-325 MG tablet Take 1 tablet by mouth every 6 (six) hours as needed for severe pain.    [provider]  losartan (COZAAR) 100 MG tablet Take 100 mg by mouth daily.     [provider]  methocarbamol (ROBAXIN) 750 MG tablet Take 750 mg by mouth at bedtime.    [provider]  oxyCODONE-acetaminophen  (PERCOCET/ROXICET) 5-325 MG tablet Take 2 tablets by mouth every 4 (four) hours as needed for severe pain. 07/21/15   Bethel BornGekas, Kelly Marie, PA-C  predniSONE (DELTASONE) 20 MG tablet Take 2 tablets (40 mg total) by mouth every morning. 07/21/15   Bethel BornGekas, Kelly Marie, PA-C  sertraline (ZOLOFT) 100 MG tablet Take 100 mg by mouth daily.    [provider]    Family History History reviewed. No pertinent family history.  Social History Social History   Tobacco Use  . Smoking status: Former Games developermoker  . Smokeless tobacco: Never Used  Substance Use Topics  . Alcohol use: Yes    Alcohol/week: 2.4 oz    Types: 4 Cans of beer per week    Comment: Occasional   . Drug use: No     Allergies   Aspirin; Nsaids; Other; and Food   Review of Systems Review of Systems  All other systems reviewed and are negative.    Physical Exam Updated Vital Signs BP (!) 153/97 (BP Location: Left Arm)   Pulse 92   Temp 98.4 F (36.9 C) (Oral)   Resp 18   Ht 5\' 7"  (1.702 m)   Wt 92.5 kg (204 lb)   SpO2 98%   BMI 31.95 kg/m   Physical Exam  Constitutional: She is oriented to person, place, and time. She appears well-developed and well-nourished.  HENT:  Head: Normocephalic and atraumatic.  Eyes: Conjunctivae and EOM are normal. Pupils are equal, round, and reactive to light.  Neck: Normal range of motion. Neck supple.  Cardiovascular: Normal rate and regular rhythm. Exam reveals no gallop and no friction rub.  No murmur heard. Pulmonary/Chest: Effort normal and breath sounds normal. No respiratory distress. She has no wheezes. She has no rales. She exhibits no tenderness.  Abdominal: Soft. Bowel sounds are normal. She exhibits no distension and no mass. There is no tenderness. There is no rebound and no guarding.  Musculoskeletal: Normal range of motion. She exhibits no edema or tenderness.  Neurological: She is alert and oriented to person, place, and time.  Skin: Skin is warm and dry.    Psychiatric: She has a normal mood and affect. Her behavior is normal. Judgment and thought content normal.  Nursing note and vitals reviewed.    ED Treatments / Results  Labs (all labs ordered are listed, but only abnormal results are displayed) Labs Reviewed  CBC WITH DIFFERENTIAL/PLATELET - Abnormal; Notable for the following components:      Result Value   WBC 14.3 (*)    Neutro Abs 9.5 (*)    All other components within normal limits  BASIC METABOLIC PANEL - Abnormal; Notable for the following components:   Glucose, Bld 126 (*)    Creatinine, Ser 1.32 (*)    GFR calc non Af Amer 43 (*)    GFR calc Af Amer 50 (*)    All other components within normal limits  URINALYSIS, ROUTINE W REFLEX MICROSCOPIC - Abnormal; Notable for the following components:   Specific Gravity, Urine >1.030 (*)    Hgb urine dipstick LARGE (*)    All other components within normal limits  URINALYSIS, MICROSCOPIC (REFLEX) - Abnormal; Notable for the following components:   Bacteria, UA RARE (*)    Squamous Epithelial / LPF 0-5 (*)    All other components within normal limits    EKG  EKG Interpretation None       Radiology Ct Renal Stone Study  Result Date: 03/26/2017 CLINICAL DATA:  Left flank and groin pain. EXAM: CT ABDOMEN AND PELVIS WITHOUT CONTRAST TECHNIQUE: Multidetector CT imaging of the abdomen and pelvis was performed following the standard protocol without IV contrast. COMPARISON:  None. FINDINGS: Lower chest: Minimal compressive atelectasis in the right lower lobe adjacent to osteophytes. Lung bases are otherwise clear. Hepatobiliary: No focal hepatic lesion allowing for lack contrast. Gallbladder physiologically distended, no calcified stone. No biliary dilatation. Pancreas: No ductal dilatation or inflammation. Spleen: Normal in size.  Punctate granuloma. Adrenals/Urinary Tract: Normal adrenal glands. Obstructing 4 mm stone at the left ureterovesicular junction with moderate  hydroureteronephrosis and perinephric edema. No right hydronephrosis. No additional nonobstructing urolithiasis. Urinary bladder is partially distended, no stone or bladder wall thickening. Stomach/Bowel: Colonic diverticulosis, prominent in the sigmoid colon, without diverticulitis. With small bowel wall thickening, inflammation or obstruction. Appendix not visualized, surgically absent per history. The stomach is nondistended. Vascular/Lymphatic: Minimal aortic atherosclerosis. No aneurysm. Small reactive appearing periportal nodes. Reproductive: Uterus and bilateral adnexa are unremarkable. Other: Postsurgical changes the anterior abdominal wall with repair of umbilical hernia. No free air, free fluid, or intra-abdominal fluid collection. Musculoskeletal: There are no acute or suspicious osseous abnormalities. Degenerative change in the lumbar spine with prominent facet arthropathy at L4-L5. IMPRESSION: 1. Obstructing 4 mm stone at the left ureterovesicular junction  with moderate hydroureteronephrosis and perinephric edema. 2. Colonic diverticulosis without acute inflammation. 3.  Minimal aortic atherosclerosis (ICD10-I70.0). Electronically Signed   By: Rubye Oaks M.D.   On: 03/26/2017 02:45    Procedures Procedures (including critical care time)  Medications Ordered in ED Medications  HYDROmorphone (DILAUDID) injection 1 mg (1 mg Intravenous Given 03/26/17 0216)  ondansetron (ZOFRAN) injection 4 mg (4 mg Intravenous Given 03/26/17 0212)     Initial Impression / Assessment and Plan / ED Course  I have reviewed the triage vital signs and the nursing notes.  Pertinent labs & imaging results that were available during my care of the patient were reviewed by me and considered in my medical decision making (see chart for details).    Patient with moderate to severe left flank pain x 2 days.  Reports associate hematuria.  Consider KS.  Will check CT.  CT consistent with 4 mm stone.  UA does  not look infected, will send for culture.  VSS.  Pain controlled.  DC to home with urology follow-up.  Patient understands and agrees with the plan.  Final Clinical Impressions(s) / ED Diagnoses   Final diagnoses:  Kidney stone    ED Discharge Orders        Ordered    oxyCODONE-acetaminophen (PERCOCET/ROXICET) 5-325 MG tablet  Every 6 hours PRN     03/26/17 0336    ondansetron (ZOFRAN ODT) 4 MG disintegrating tablet  Every 8 hours PRN     03/26/17 0336    phenazopyridine (PYRIDIUM) 95 MG tablet  3 times daily PRN     03/26/17 0336       Roxy Horseman, PA-C 03/26/17 1610    Dione Booze, MD 03/26/17 [phone]

## 2018-04-03 ENCOUNTER — Emergency Department (HOSPITAL_COMMUNITY)
Admission: EM | Admit: 2018-04-03 | Discharge: 2018-04-03 | Disposition: A | Discharge status: Home / Self Care | Payer: Medicaid Other | Attending: Emergency Medicine | Admitting: Emergency Medicine

## 2018-04-03 ENCOUNTER — Encounter (HOSPITAL_COMMUNITY): Payer: Self-pay | Admitting: *Deleted

## 2018-04-03 ENCOUNTER — Other Ambulatory Visit: Payer: Self-pay

## 2018-04-03 DIAGNOSIS — Z79899 Other long term (current) drug therapy: Secondary | ICD-10-CM | POA: Insufficient documentation

## 2018-04-03 DIAGNOSIS — I10 Essential (primary) hypertension: Secondary | ICD-10-CM | POA: Diagnosis not present

## 2018-04-03 DIAGNOSIS — B029 Zoster without complications: Secondary | ICD-10-CM | POA: Diagnosis not present

## 2018-04-03 DIAGNOSIS — Z87891 Personal history of nicotine dependence: Secondary | ICD-10-CM | POA: Insufficient documentation

## 2018-04-03 DIAGNOSIS — R21 Rash and other nonspecific skin eruption: Secondary | ICD-10-CM | POA: Diagnosis present

## 2018-04-03 MED ORDER — OXYCODONE-ACETAMINOPHEN 5-325 MG PO TABS: 1.0000 | ORAL_TABLET | Freq: Four times a day (QID) | ORAL | 0 refills | Status: DC | PRN

## 2018-04-03 MED ORDER — VALACYCLOVIR HCL 1 G PO TABS: 1000.0000 mg | ORAL_TABLET | Freq: Three times a day (TID) | ORAL | 0 refills | Status: AC

## 2018-04-03 MED ORDER — OXYCODONE-ACETAMINOPHEN 5-325 MG PO TABS
1.0000 | ORAL_TABLET | Freq: Once | ORAL | Status: AC
  Administered 2018-04-03: 1 via ORAL
  Filled 2018-04-03: qty 1

## 2018-04-03 MED ORDER — VALACYCLOVIR HCL 500 MG PO TABS
1000.0000 mg | ORAL_TABLET | Freq: Once | ORAL | Status: AC
  Administered 2018-04-03: 1000 mg via ORAL
  Filled 2018-04-03: qty 2

## 2018-04-03 NOTE — ED Provider Notes (Signed)
[facility]-EMERGENCY DEPT Provider Note   CSN: [id number] Arrival date & time: 04/03/18  1422     History   Chief Complaint Chief Complaint  Patient presents with  . Herpes Zoster    HPI Jane Jane Murphy is a 62 y.o. female hx of Jane Murphy, Jane Jane Murphy, Jane Jane Murphy, Jane Jane Murphy 3 days ago, she had a cortisone shot in her hip and a Jane Murphy shot.  2 days ago, patient noticed a spot in the her nose and the left side of her face.  States that it appears like vesicles like her Jane Jane Murphy.  She also noticed sores in her mouth and some nausea vomiting.  Patient was concerned that she had recurrent Jane Murphy after the shot.  She is already on gabapentin and she is requesting Valtrex.  Patient states that she has numerous episodes of Jane Murphy, most recently Jane Murphy 3 months ago.  She finished a course of Valtrex at that time and actually saw ophthalmology for herpes ophthalmicus.   The history is provided by the patient.    Past Medical History:  Diagnosis Date  . Arthritis   . Hypertension   . Osteoarthritis   . Jane Jane Murphy (post-traumatic stress disorder)   . Rectocele   . Jane Murphy     There are no active problems to display for this patient.   Past Surgical History:  Procedure Laterality Date  . APPENDECTOMY    . C section    . CHOLECYSTECTOMY    . HERNIA REPAIR    . KNEE SURGERY    . OOPHORECTOMY    . SPINAL FUSION    . TONSILLECTOMY       OB History   No obstetric history on file.      Home Medications    Prior to Admission medications   Medication Sig Start Date End Date Taking? Authorizing Provider  atorvastatin (LIPITOR) 10 MG tablet Take 10 mg by mouth daily.    [provider]  Cholecalciferol (VITAMIN D) 2000 UNITS tablet Take 2,000 Units by mouth daily.    [provider]  fluticasone (FLONASE) 50 MCG/ACT nasal spray Place 1 spray into both nostrils daily as needed for allergies  or rhinitis.    [provider]  HYDROcodone-acetaminophen (NORCO) 10-325 MG tablet Take 1 tablet by mouth every 6 (six) hours as needed for severe pain.    [provider]  loratadine (CLARITIN) 10 MG tablet Take 10 mg by mouth daily as needed for allergies.    [provider]  losartan (COZAAR) 100 MG tablet Take 100 mg by mouth daily.     [provider]  methocarbamol (ROBAXIN) 750 MG tablet Take 750 mg by mouth every 6 (six) hours as needed for muscle spasms.     [provider]  ondansetron (ZOFRAN ODT) 4 MG disintegrating tablet Take 1 tablet (4 mg total) by mouth every 8 (eight) hours as needed for nausea or vomiting. 03/26/17   Roxy HorsemanBrowning, Robert, PA-C  oxyCODONE-acetaminophen (PERCOCET/ROXICET) 5-325 MG tablet Take 2 tablets by mouth every 6 (six) hours as needed for severe pain. 03/26/17   Roxy HorsemanBrowning, Robert, PA-C  phenazopyridine (PYRIDIUM) 95 MG tablet Take 1 tablet (95 mg total) by mouth 3 (three) times daily as needed for pain. 03/26/17   Roxy HorsemanBrowning, Robert, PA-C  sertraline (ZOLOFT) 100 MG tablet Take 100 mg by mouth daily.    [provider]    Family History No family history on file.  Social History  Social History   Tobacco Use  . Smoking status: Former Games developermoker  . Smokeless tobacco: Never Used  Substance Use Topics  . Alcohol use: Yes    Alcohol/week: 4.0 standard drinks    Types: 4 Cans of beer per week    Comment: Occasional   . Drug use: No     Allergies   Aspirin; Nsaids; Other; and Food   Review of Systems Review of Systems  Skin: Positive for rash.  All other systems reviewed and are negative.    Physical Exam Updated Vital Signs BP (!) 154/96 (BP Location: Left Arm)   Pulse 82   Temp 98.1 F (36.7 C) (Oral)   Resp 18   Ht 5\' 7"  (1.702 m)   Wt 108.9 kg   SpO2 96%   BMI 37.59 kg/m   Physical Exam Vitals signs and nursing note reviewed.  HENT:     Head: Normocephalic.     Right Ear: Tympanic  membrane normal.     Left Ear: Tympanic membrane normal.     Nose: Nose normal.     Mouth/Throat:     Mouth: Mucous membranes are moist.  Eyes:     Extraocular Movements: Extraocular movements intact.     Pupils: Pupils are equal, round, and reactive to light.  Neck:     Musculoskeletal: Normal range of motion.  Cardiovascular:     Rate and Rhythm: Normal rate.  Pulmonary:     Effort: Pulmonary effort is normal.  Abdominal:     General: Abdomen is flat.  Musculoskeletal: Normal range of motion.  Skin:    General: Skin is warm.     Capillary Refill: Capillary refill takes less than 2 seconds.     Comments: Vesicle L nostril, small vesicles L V1 distribution. No obvious conjunctivitis   Neurological:     General: No focal deficit present.     Mental Status: She is alert and oriented to person, place, and time.  Psychiatric:        Mood and Affect: Mood normal.        Behavior: Behavior normal.      ED Treatments / Results  Labs (all labs ordered are listed, but only abnormal results are displayed) Labs Reviewed - No data to display  EKG None  Radiology No results found.  Procedures Procedures (including critical care time)  Medications Ordered in ED Medications  valACYclovir (VALTREX) tablet 1,000 mg (has no administration in time range)  oxyCODONE-acetaminophen (PERCOCET/ROXICET) 5-325 MG per tablet 1 tablet (has no administration in time range)     Initial Impression / Assessment and Plan / ED Course  I have reviewed the triage vital signs and the nursing notes.  Pertinent labs & imaging results that were available during my care of the patient were reviewed by me and considered in my medical decision making (see chart for details).    Ariany Eulogio Jane Murphy is a 62 y.o. female Jane with possible Jane Murphy after Jane Murphy shot. Well appearing. Had Jane Jane Murphy. Will give valtrex, pain meds. She is already on gabapentin. Stable for discharge.    Final  Clinical Impressions(s) / ED Diagnoses   Final diagnoses:  None    ED Discharge Orders    None       Charlynne PanderYao, Daegen Berrocal Hsienta, MD 04/03/18 1645

## 2018-04-03 NOTE — ED Triage Notes (Signed)
Pt had shingles vaccine Thursday morning, Friday night low fever, Pt states she is having a break out in her throat, around left eye and in eye, in left nare. No open areas

## 2018-04-03 NOTE — ED Notes (Signed)
Bed: [facility] Expected date:  Expected time:  Means of arrival:  Comments: 

## 2018-04-03 NOTE — ED Notes (Signed)
ED Provider at bedside. 

## 2018-04-03 NOTE — Discharge Instructions (Addendum)
Take valtrex as prescribed.   Continue gabapentin.   Take percocet for severe pain.   See your doctor next week   Follow up with your eye doctor   Return to ER if you have fever, worse rash, blurry vision.

## 2018-04-22 ENCOUNTER — Encounter: Payer: Self-pay | Admitting: Family Medicine

## 2018-04-22 ENCOUNTER — Ambulatory Visit (INDEPENDENT_AMBULATORY_CARE_PROVIDER_SITE_OTHER): Payer: Medicaid Other | Admitting: Family Medicine

## 2018-04-22 VITALS — BP 114/68 | HR 66 | Temp 97.8°F | Ht 67.0 in | Wt 241.0 lb

## 2018-04-22 DIAGNOSIS — T7840XA Allergy, unspecified, initial encounter: Secondary | ICD-10-CM | POA: Diagnosis not present

## 2018-04-22 DIAGNOSIS — Z09 Encounter for follow-up examination after completed treatment for conditions other than malignant neoplasm: Secondary | ICD-10-CM | POA: Diagnosis not present

## 2018-04-22 DIAGNOSIS — R7303 Prediabetes: Secondary | ICD-10-CM

## 2018-04-22 DIAGNOSIS — T7840XD Allergy, unspecified, subsequent encounter: Secondary | ICD-10-CM

## 2018-04-22 DIAGNOSIS — M65311 Trigger thumb, right thumb: Secondary | ICD-10-CM | POA: Diagnosis not present

## 2018-04-22 DIAGNOSIS — Z0000 Encounter for general adult medical examination without abnormal findings: Secondary | ICD-10-CM

## 2018-04-22 DIAGNOSIS — Z6837 Body mass index (BMI) 37.0-37.9, adult: Secondary | ICD-10-CM

## 2018-04-22 DIAGNOSIS — E66812 Obesity, class 2: Secondary | ICD-10-CM | POA: Insufficient documentation

## 2018-04-22 DIAGNOSIS — Z7689 Persons encountering health services in other specified circumstances: Secondary | ICD-10-CM

## 2018-04-22 DIAGNOSIS — E785 Hyperlipidemia, unspecified: Secondary | ICD-10-CM | POA: Insufficient documentation

## 2018-04-22 DIAGNOSIS — M1990 Unspecified osteoarthritis, unspecified site: Secondary | ICD-10-CM

## 2018-04-22 LAB — POCT URINALYSIS DIP (MANUAL ENTRY)
Bilirubin, UA: NEGATIVE
Blood, UA: NEGATIVE
Glucose, UA: NEGATIVE mg/dL
Ketones, POC UA: NEGATIVE mg/dL
Leukocytes, UA: NEGATIVE
Nitrite, UA: NEGATIVE
Protein Ur, POC: NEGATIVE mg/dL
Spec Grav, UA: 1.025 (ref 1.010–1.025)
Urobilinogen, UA: 0.2 E.U./dL
pH, UA: 5.5 (ref 5.0–8.0)

## 2018-04-22 MED ORDER — TRAMADOL HCL 50 MG PO TABS: 50.0000 mg | ORAL_TABLET | Freq: Three times a day (TID) | ORAL | 0 refills | Status: DC | PRN

## 2018-04-22 MED ORDER — FLUTICASONE PROPIONATE 50 MCG/ACT NA SUSP
1.0000 | Freq: Every day | NASAL | 3 refills | Status: AC | PRN
Start: 2018-04-22 — End: ?

## 2018-04-22 MED ORDER — ACETAMINOPHEN 500 MG PO TABS: 500.0000 mg | ORAL_TABLET | Freq: Two times a day (BID) | ORAL | 1 refills | Status: AC

## 2018-04-22 MED ORDER — PREDNISONE 10 MG PO TABS: 10.0000 mg | ORAL_TABLET | Freq: Every day | ORAL | 0 refills | Status: DC

## 2018-04-22 MED ORDER — METFORMIN HCL 500 MG PO TABS: 500.0000 mg | ORAL_TABLET | Freq: Every day | ORAL | 3 refills | Status: DC

## 2018-04-22 MED ORDER — ATORVASTATIN CALCIUM 20 MG PO TABS: 20.0000 mg | ORAL_TABLET | Freq: Every day | ORAL | 3 refills | Status: DC
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
## 2018-04-23 LAB — COMPREHENSIVE METABOLIC PANEL
ALT: 8 IU/L (ref 0–32)
AST: 17 IU/L (ref 0–40)
Albumin/Globulin Ratio: 1.7 (ref 1.2–2.2)
Albumin: 4.4 g/dL (ref 3.6–4.8)
Alkaline Phosphatase: 105 IU/L (ref 39–117)
BUN/Creatinine Ratio: 13 (ref 12–28)
BUN: 10 mg/dL (ref 8–27)
Bilirubin Total: 0.3 mg/dL (ref 0.0–1.2)
CO2: 23 mmol/L (ref 20–29)
Calcium: 10.1 mg/dL (ref 8.7–10.3)
Chloride: 104 mmol/L (ref 96–106)
Creatinine, Ser: 0.79 mg/dL (ref 0.57–1.00)
GFR calc Af Amer: 94 mL/min/{1.73_m2} (ref >59)
GFR calc non Af Amer: 82 mL/min/{1.73_m2} (ref >59)
Globulin, Total: 2.6 g/dL (ref 1.5–4.5)
Glucose: 105 mg/dL — ABNORMAL HIGH (ref 65–99)
Potassium: 4.3 mmol/L (ref 3.5–5.2)
Sodium: 142 mmol/L (ref 134–144)
Total Protein: 7 g/dL (ref 6.0–8.5)

## 2018-04-23 LAB — CBC WITH DIFFERENTIAL/PLATELET
Basophils Absolute: 0.1 10*3/uL (ref 0.0–0.2)
Basos: 1 %
EOS (ABSOLUTE): 0.6 10*3/uL — ABNORMAL HIGH (ref 0.0–0.4)
Eos: 7 %
Hematocrit: 37.8 % (ref 34.0–46.6)
Hemoglobin: 13.1 g/dL (ref 11.1–15.9)
Immature Grans (Abs): 0 10*3/uL (ref 0.0–0.1)
Immature Granulocytes: 0 %
Lymphocytes Absolute: 3.7 10*3/uL — ABNORMAL HIGH (ref 0.7–3.1)
Lymphs: 37 %
MCH: 29.7 pg (ref 26.6–33.0)
MCHC: 34.7 g/dL (ref 31.5–35.7)
MCV: 86 fL (ref 79–97)
Monocytes Absolute: 0.7 10*3/uL (ref 0.1–0.9)
Monocytes: 7 %
Neutrophils Absolute: 4.8 10*3/uL (ref 1.4–7.0)
Neutrophils: 48 %
Platelets: 301 10*3/uL (ref 150–450)
RBC: 4.41 x10E6/uL (ref 3.77–5.28)
RDW: 12.5 % (ref 11.7–15.4)
WBC: 9.9 10*3/uL (ref 3.4–10.8)

## 2018-04-23 LAB — LIPID PANEL
Chol/HDL Ratio: 3.8 ratio (ref 0.0–4.4)
Cholesterol, Total: 138 mg/dL (ref 100–199)
HDL: 36 mg/dL — ABNORMAL LOW (ref >39)
LDL Calculated: 57 mg/dL (ref 0–99)
Triglycerides: 224 mg/dL — ABNORMAL HIGH (ref 0–149)
VLDL Cholesterol Cal: 45 mg/dL — ABNORMAL HIGH (ref 5–40)

## 2018-04-23 LAB — ARTHRITIS PANEL
Anti Nuclear Antibody(ANA): NEGATIVE
Rheumatoid fact SerPl-aCnc: <10 [IU]/mL (ref 0.0–13.9)
Sed Rate: 20 mm/hr (ref 0–40)
Uric Acid: 3.9 mg/dL (ref 2.5–7.1)

## 2018-04-23 LAB — TSH: TSH: 2.94 u[IU]/mL (ref 0.450–4.500)

## 2018-04-23 LAB — VITAMIN D 25 HYDROXY (VIT D DEFICIENCY, FRACTURES): Vit D, 25-Hydroxy: 21.3 ng/mL — ABNORMAL LOW (ref 30.0–100.0)

## 2018-04-25 ENCOUNTER — Telehealth: Payer: Self-pay

## 2018-04-25 NOTE — Telephone Encounter (Signed)
Patient did get her labs drawn and results are back.

## 2018-04-25 NOTE — Telephone Encounter (Signed)
-----   Message from Kallie Locks, FNP sent at 04/22/2018  9:03 PM EST ----- Regarding: "Missed Labs" Patient did not get lab draw after her appointment. Please contact patient to schedule for lab draw only. Remind patient to schedule early and she should be fasting at that time. Thank you.

## 2018-04-26 NOTE — Telephone Encounter (Signed)
-----   Message from Kallie Locks, FNP sent at 04/25/2018 10:35 PM EST ----- Regarding: "Vitamin D Deficiency" Vitamin D level is low. Please inform patient to take OTC daily Vitamin D supplement. She will keep follow up appointment.

## 2018-04-26 NOTE — Telephone Encounter (Signed)
Patient notified

## 2018-04-26 NOTE — Telephone Encounter (Signed)
Patient was notified of results and states that a order for imaging was suppose to be placed for her right thumb.

## 2018-04-26 NOTE — Telephone Encounter (Signed)
Patient would like to have a order for a shower chair and brace or splint for her thumb. Patient is unable to bend or do anything with the thumb and has a hard time doing ADL.

## 2018-04-27 ENCOUNTER — Telehealth: Payer: Self-pay

## 2018-04-27 NOTE — Telephone Encounter (Signed)
Jane Murphy the pharmacy manager states that patient had been having some problems with not getting all her medication on Tramadol. He states that he counted the last script and it was 30 pills but patient called stating that she only got 11 pills. He let patient know that he couldn't do anything because he gave 30 pills.

## 2018-04-28 NOTE — Telephone Encounter (Signed)
Pharmacy notified as well 

## 2018-05-23 ENCOUNTER — Other Ambulatory Visit: Payer: Self-pay | Admitting: Family Medicine

## 2018-05-23 ENCOUNTER — Encounter: Payer: Self-pay | Admitting: Family Medicine

## 2018-05-23 DIAGNOSIS — M65311 Trigger thumb, right thumb: Secondary | ICD-10-CM

## 2018-05-23 NOTE — Telephone Encounter (Signed)
Patient states that she will not be coming to [place] anymore because she was not treated correctly for this injury. Patient state that she had to wait a month and then the referral is for therapy instead of surgery. Patient also was upset that when she called in about her Tramadol that she was talked down to.  Patient states she is not upset

## 2018-05-23 NOTE — Telephone Encounter (Signed)
Patient state that she has a detached tendon and needs surgery to reattach before she does therapy. Patient states that it's very difficult to use her cane and keep her balance with this finger detached.

## 2018-05-23 NOTE — Progress Notes (Signed)
Discussed with patient at initial appointment (04/22/2018) that we would further discuss treatment of Trigger Finger at her next follow up appointment on 05/25/2018.  Rx for Tramadol and Prednisone were written at initiatal office visit to treat her current discomfort. Order proactively placed for Occupational Therapy today, prior to her office appointment in 2 days (05/25/2018). Nurse called patient today to inform that we placed order for Occupational Therapy and will discuss and further evaluate plans for further treatment of Trigger Finger at her appointment on 05/25/2018.

## 2018-05-23 NOTE — Telephone Encounter (Signed)
-----   Message from Kallie Locks, FNP sent at 05/23/2018 11:02 AM EST ----- Regarding: "Trigger Finger" Please inform patient that we have referred her to Occupational Therapy for her Trigger Finger. Thank you.

## 2018-05-25 ENCOUNTER — Ambulatory Visit: Payer: Non-veteran care | Admitting: Family Medicine

## 2018-11-24 ENCOUNTER — Emergency Department (HOSPITAL_COMMUNITY): Payer: Medicaid Other

## 2018-11-24 ENCOUNTER — Emergency Department (HOSPITAL_COMMUNITY)
Admission: EM | Admit: 2018-11-24 | Discharge: 2018-11-24 | Disposition: A | Discharge status: Home / Self Care | Payer: Medicaid Other | Attending: Emergency Medicine | Admitting: Emergency Medicine

## 2018-11-24 ENCOUNTER — Encounter (HOSPITAL_COMMUNITY): Payer: Self-pay | Admitting: Emergency Medicine

## 2018-11-24 ENCOUNTER — Other Ambulatory Visit: Payer: Self-pay

## 2018-11-24 DIAGNOSIS — R1032 Left lower quadrant pain: Secondary | ICD-10-CM | POA: Diagnosis not present

## 2018-11-24 DIAGNOSIS — I10 Essential (primary) hypertension: Secondary | ICD-10-CM | POA: Insufficient documentation

## 2018-11-24 DIAGNOSIS — Z79899 Other long term (current) drug therapy: Secondary | ICD-10-CM | POA: Diagnosis not present

## 2018-11-24 DIAGNOSIS — Z7984 Long term (current) use of oral hypoglycemic drugs: Secondary | ICD-10-CM | POA: Diagnosis not present

## 2018-11-24 DIAGNOSIS — Z87891 Personal history of nicotine dependence: Secondary | ICD-10-CM | POA: Insufficient documentation

## 2018-11-24 DIAGNOSIS — R109 Unspecified abdominal pain: Secondary | ICD-10-CM

## 2018-11-24 DIAGNOSIS — N3289 Other specified disorders of bladder: Secondary | ICD-10-CM | POA: Insufficient documentation

## 2018-11-24 HISTORY — DX: Calculus of kidney: N20.0

## 2018-11-24 LAB — CBC WITH DIFFERENTIAL/PLATELET
Abs Immature Granulocytes: 0.05 10*3/uL (ref 0.00–0.07)
Basophils Absolute: 0.1 10*3/uL (ref 0.0–0.1)
Basophils Relative: 1 %
Eosinophils Absolute: 0 10*3/uL (ref 0.0–0.5)
Eosinophils Relative: 0 %
HCT: 41.1 % (ref 36.0–46.0)
Hemoglobin: 13.7 g/dL (ref 12.0–15.0)
Immature Granulocytes: 0 %
Lymphocytes Relative: 22 %
Lymphs Abs: 3.3 10*3/uL (ref 0.7–4.0)
MCH: 29.7 pg (ref 26.0–34.0)
MCHC: 33.3 g/dL (ref 30.0–36.0)
MCV: 89.2 fL (ref 80.0–100.0)
Monocytes Absolute: 0.9 10*3/uL (ref 0.1–1.0)
Monocytes Relative: 6 %
Neutro Abs: 10.2 10*3/uL — ABNORMAL HIGH (ref 1.7–7.7)
Neutrophils Relative %: 71 %
Platelets: 272 10*3/uL (ref 150–400)
RBC: 4.61 MIL/uL (ref 3.87–5.11)
RDW: 12.5 % (ref 11.5–15.5)
WBC: 14.6 10*3/uL — ABNORMAL HIGH (ref 4.0–10.5)
nRBC: 0 % (ref 0.0–0.2)

## 2018-11-24 LAB — COMPREHENSIVE METABOLIC PANEL
ALT: 21 U/L (ref 0–44)
AST: 36 U/L (ref 15–41)
Albumin: 4.8 g/dL (ref 3.5–5.0)
Alkaline Phosphatase: 88 U/L (ref 38–126)
Anion gap: 14 (ref 5–15)
BUN: 12 mg/dL (ref 8–23)
CO2: 22 mmol/L (ref 22–32)
Calcium: 9.5 mg/dL (ref 8.9–10.3)
Chloride: 108 mmol/L (ref 98–111)
Creatinine, Ser: 0.98 mg/dL (ref 0.44–1.00)
GFR calc Af Amer: >60 mL/min (ref >60)
GFR calc non Af Amer: >60 mL/min (ref >60)
Glucose, Bld: 122 mg/dL — ABNORMAL HIGH (ref 70–99)
Potassium: 3.3 mmol/L — ABNORMAL LOW (ref 3.5–5.1)
Sodium: 144 mmol/L (ref 135–145)
Total Bilirubin: 0.8 mg/dL (ref 0.3–1.2)
Total Protein: 8.3 g/dL — ABNORMAL HIGH (ref 6.5–8.1)

## 2018-11-24 LAB — URINALYSIS, ROUTINE W REFLEX MICROSCOPIC
Bacteria, UA: NONE SEEN
Bilirubin Urine: NEGATIVE
Glucose, UA: NEGATIVE mg/dL
Hgb urine dipstick: NEGATIVE
Ketones, ur: NEGATIVE mg/dL
Leukocytes,Ua: NEGATIVE
Nitrite: POSITIVE — AB
Protein, ur: NEGATIVE mg/dL
Specific Gravity, Urine: 1.015 (ref 1.005–1.030)
pH: 5 (ref 5.0–8.0)

## 2018-11-24 MED ORDER — HYDROMORPHONE HCL 1 MG/ML IJ SOLN
1.0000 mg | Freq: Once | INTRAMUSCULAR | Status: AC
  Administered 2018-11-24: 18:00:00 1 mg via INTRAVENOUS
  Filled 2018-11-24: qty 1

## 2018-11-24 MED ORDER — HYDROCODONE-ACETAMINOPHEN 5-325 MG PO TABS: 1.0000 | ORAL_TABLET | ORAL | 0 refills | Status: DC | PRN

## 2018-11-24 MED ORDER — ONDANSETRON HCL 4 MG/2ML IJ SOLN
4.0000 mg | Freq: Once | INTRAMUSCULAR | Status: AC
  Administered 2018-11-24: 17:00:00 4 mg via INTRAVENOUS
  Filled 2018-11-24: qty 2

## 2018-11-24 MED ORDER — MORPHINE SULFATE (PF) 4 MG/ML IV SOLN
4.0000 mg | Freq: Once | INTRAVENOUS | Status: AC
  Administered 2018-11-24: 17:00:00 4 mg via INTRAVENOUS
  Filled 2018-11-24: qty 1

## 2018-11-24 MED ORDER — SODIUM CHLORIDE 0.9 % IV BOLUS
1000.0000 mL | Freq: Once | INTRAVENOUS | Status: AC
  Administered 2018-11-24: 1000 mL via INTRAVENOUS

## 2018-11-24 MED ORDER — LORAZEPAM 2 MG/ML IJ SOLN
1.0000 mg | Freq: Once | INTRAMUSCULAR | Status: AC
  Administered 2018-11-24: 18:00:00 1 mg via INTRAVENOUS
  Filled 2018-11-24: qty 1

## 2018-11-24 MED ORDER — DIAZEPAM 5 MG PO TABS: 5.0000 mg | ORAL_TABLET | Freq: Two times a day (BID) | ORAL | 0 refills | Status: DC

## 2018-11-24 NOTE — ED Provider Notes (Signed)
[facility]-EMERGENCY DEPT Provider Note   CSN: [id number] Arrival date & time: 11/24/18  1315     History   Chief Complaint Chief Complaint  Patient presents with  . Flank Pain  . Nephrolithiasis    HPI Jane Murphy is a 63 y.o. female.     Pt presents to the ED today with left sided flank pain.  She has a hx of kidney stones and passed a 4 mm stone yesterday and a 6 mm stone this morning.  Her pcp put her on pyridium and keflex and zofran.  She is in a lot of pain. She denies any f/c.       Past Medical History:  Diagnosis Date  . Arthritis   . Hypertension   . Kidney stone   . Osteoarthritis   . PTSD (post-traumatic stress disorder)   . Rectocele   . Shingles   . Trigger finger of right thumb     Patient Active Problem List   Diagnosis Date Noted  . Trigger finger of right thumb 04/22/2018  . Allergies 04/22/2018  . Chronic osteoarthritis 04/22/2018  . Class 2 severe obesity due to excess calories with serious comorbidity and body mass index (BMI) of 37.0 to 37.9 in adult (HCC) 04/22/2018  . Hyperlipidemia 04/22/2018    Past Surgical History:  Procedure Laterality Date  . APPENDECTOMY    . C section    . CHOLECYSTECTOMY    . HERNIA REPAIR    . KNEE SURGERY    . OOPHORECTOMY    . SPINAL FUSION    . TONSILLECTOMY       OB History   No obstetric history on file.      Home Medications    Prior to Admission medications   Medication Sig Start Date End Date Taking? Authorizing Provider  acetaminophen (TYLENOL) 500 MG tablet Take 1 tablet (500 mg total) by mouth 2 (two) times daily. Patient taking differently: Take 1,000 mg by mouth every 6 (six) hours as needed for mild pain.  04/22/18  Yes Kallie LocksStroud, Natalie M, FNP  atorvastatin (LIPITOR) 20 MG tablet Take 1 tablet (20 mg total) by mouth daily. 04/22/18  Yes Kallie LocksStroud, Natalie M, FNP  fluticasone (FLONASE) 50 MCG/ACT nasal spray Place 1 spray into both nostrils daily as needed  for allergies or rhinitis. 04/22/18  Yes Kallie LocksStroud, Natalie M, FNP  losartan (COZAAR) 100 MG tablet Take 100 mg by mouth daily.   Yes [provider]  metFORMIN (GLUCOPHAGE) 500 MG tablet Take 1 tablet (500 mg total) by mouth daily. 04/22/18  Yes Kallie LocksStroud, Natalie M, FNP  ondansetron (ZOFRAN-ODT) 4 MG disintegrating tablet Take 4 mg by mouth every 8 (eight) hours as needed for nausea or vomiting.   Yes [provider]  Phenazopyridine HCl (PYRIDIUM PO) Take 1 mg by mouth daily as needed (urine pain).   Yes [provider]  diazepam (VALIUM) 5 MG tablet Take 1 tablet (5 mg total) by mouth 2 (two) times daily. 11/24/18   Jacalyn LefevreHaviland, Algie Westry, MD  HYDROcodone-acetaminophen (NORCO/VICODIN) 5-325 MG tablet Take 1 tablet by mouth every 4 (four) hours as needed. 11/24/18   Jacalyn LefevreHaviland, Imagine Nest, MD  predniSONE (DELTASONE) 10 MG tablet Take 1 tablet (10 mg total) by mouth daily with breakfast. Patient not taking: Reported on 11/24/2018 04/22/18   Kallie LocksStroud, Natalie M, FNP  traMADol (ULTRAM) 50 MG tablet Take 1 tablet (50 mg total) by mouth every 8 (eight) hours as needed. Patient not taking: Reported on 11/24/2018 04/22/18  Azzie Glatter, FNP    Family History No family history on file.  Social History Social History   Tobacco Use  . Smoking status: Former Research scientist (life sciences)  . Smokeless tobacco: Never Used  Substance Use Topics  . Alcohol use: Yes    Alcohol/week: 4.0 standard drinks    Types: 4 Cans of beer per week    Comment: Occasional   . Drug use: No     Allergies   Aspirin, Nsaids, Other, and Food   Review of Systems Review of Systems  Gastrointestinal: Positive for nausea and vomiting.  Genitourinary: Positive for dysuria and flank pain.  All other systems reviewed and are negative.    Physical Exam Updated Vital Signs BP (!) 133/108   Pulse 84   Temp 98.4 F (36.9 C) (Oral)   Resp 18   SpO2 98%   Physical Exam Vitals signs and nursing note reviewed.  Constitutional:       Appearance: Normal appearance. She is not ill-appearing.  HENT:     Head: Normocephalic and atraumatic.     Right Ear: External ear normal.     Left Ear: External ear normal.     Nose: Nose normal.     Mouth/Throat:     Mouth: Mucous membranes are dry.     Pharynx: Oropharynx is clear.  Eyes:     Extraocular Movements: Extraocular movements intact.     Conjunctiva/sclera: Conjunctivae normal.     Pupils: Pupils are equal, round, and reactive to light.  Neck:     Musculoskeletal: Normal range of motion and neck supple.  Cardiovascular:     Rate and Rhythm: Normal rate and regular rhythm.     Pulses: Normal pulses.     Heart sounds: Normal heart sounds.  Pulmonary:     Effort: Pulmonary effort is normal.     Breath sounds: Normal breath sounds.  Abdominal:     General: Abdomen is flat. Bowel sounds are normal.     Palpations: Abdomen is soft.  Musculoskeletal: Normal range of motion.  Skin:    General: Skin is warm.     Capillary Refill: Capillary refill takes less than 2 seconds.  Neurological:     General: No focal deficit present.     Mental Status: She is alert and oriented to person, place, and time.  Psychiatric:        Mood and Affect: Mood normal.        Behavior: Behavior normal.        Thought Content: Thought content normal.        Judgment: Judgment normal.      ED Treatments / Results  Labs (all labs ordered are listed, but only abnormal results are displayed) Labs Reviewed  URINALYSIS, ROUTINE W REFLEX MICROSCOPIC - Abnormal; Notable for the following components:      Result Value   Color, Urine AMBER (*)    Nitrite POSITIVE (*)    All other components within normal limits  COMPREHENSIVE METABOLIC PANEL - Abnormal; Notable for the following components:   Potassium 3.3 (*)    Glucose, Bld 122 (*)    Total Protein 8.3 (*)    All other components within normal limits  CBC WITH DIFFERENTIAL/PLATELET - Abnormal; Notable for the following components:    WBC 14.6 (*)    Neutro Abs 10.2 (*)    All other components within normal limits  URINE CULTURE    EKG None  Radiology Ct Renal Stone Study  Result Date:  11/24/2018 CLINICAL DATA:  Flank pain and dark urine EXAM: CT ABDOMEN AND PELVIS WITHOUT CONTRAST TECHNIQUE: Multidetector CT imaging of the abdomen and pelvis was performed following the standard protocol without IV contrast. COMPARISON:  March 26, 2017 FINDINGS: Lower chest: No acute abnormality. Hepatobiliary: No focal liver abnormality is seen. No gallstones, gallbladder wall thickening, or biliary dilatation. Pancreas: Unremarkable. No pancreatic ductal dilatation or surrounding inflammatory changes. Spleen: Normal in size without focal abnormality. Adrenals/Urinary Tract: Adrenal glands are unremarkable. Kidneys are normal, without renal calculi, focal lesion, or hydronephrosis. Bladder is unremarkable. Stomach/Bowel: Stomach is within normal limits. Patient status post prior appendectomy. No evidence of bowel wall thickening, distention, or inflammatory changes. There is diverticulosis of colon without diverticulitis. Vascular/Lymphatic: Aortic atherosclerosis. No enlarged abdominal or pelvic lymph nodes. Reproductive: Uterus and bilateral adnexa are unremarkable. Other: None. Musculoskeletal: Degenerative joint changes of the spine are identified. IMPRESSION: No nephrolithiasis or hydronephrosis identified bilaterally. No acute abnormality identified in the abdomen and pelvis. Electronically Signed   By: Sherian ReinWei-Chen  Lin M.D.   On: 11/24/2018 17:30    Procedures Procedures (including critical care time)  Medications Ordered in ED Medications  sodium chloride 0.9 % bolus 1,000 mL (1,000 mLs Intravenous New Bag/Given 11/24/18 1653)  morphine 4 MG/ML injection 4 mg (4 mg Intravenous Given 11/24/18 1648)  ondansetron (ZOFRAN) injection 4 mg (4 mg Intravenous Given 11/24/18 1648)  HYDROmorphone (DILAUDID) injection 1 mg (1 mg Intravenous  Given 11/24/18 1736)  LORazepam (ATIVAN) injection 1 mg (1 mg Intravenous Given 11/24/18 1736)     Initial Impression / Assessment and Plan / ED Course  I have reviewed the triage vital signs and the nursing notes.  Pertinent labs & imaging results that were available during my care of the patient were reviewed by me and considered in my medical decision making (see chart for details).     Pt is feeling much better.  No sign of kidney stone on CT today.  Pt is stable for d/c.  Return if worse.  Final Clinical Impressions(s) / ED Diagnoses   Final diagnoses:  Left flank pain  Bladder spasm    ED Discharge Orders         Ordered    HYDROcodone-acetaminophen (NORCO/VICODIN) 5-325 MG tablet  Every 4 hours PRN     11/24/18 1817    diazepam (VALIUM) 5 MG tablet  2 times daily     11/24/18 1817           Jacalyn LefevreHaviland, Emilliano Dilworth, MD 11/24/18 1819

## 2018-11-24 NOTE — ED Triage Notes (Signed)
Per [facility] pt from home for flank pain and blood in urine since passing a kidney stone this morning. Been on meds through [place] for stones.  Vitals: 150/90, 98HR, 98.5 temp, 97% on RA.

## 2018-11-24 NOTE — ED Notes (Signed)
Social Work contacted and states that they will provide a Chief Strategy Officer.

## 2018-11-24 NOTE — ED Notes (Signed)
Patient transported to CT 

## 2018-11-24 NOTE — ED Triage Notes (Signed)
Pt states that she passed a 72mm stone yesterday and 75mm stone this monring. Took Pyridium and Zofran today and on antibiotics for past 5 days.

## 2018-11-25 LAB — URINE CULTURE: Culture: NO GROWTH

## 2019-06-08 ENCOUNTER — Encounter (HOSPITAL_COMMUNITY): Payer: Self-pay

## 2019-06-08 ENCOUNTER — Emergency Department (HOSPITAL_COMMUNITY): Payer: Medicaid Other

## 2019-06-08 ENCOUNTER — Other Ambulatory Visit: Payer: Self-pay

## 2019-06-08 ENCOUNTER — Inpatient Hospital Stay (HOSPITAL_COMMUNITY)
Admission: EM | Admit: 2019-06-08 | Discharge: 2019-06-12 | DRG: 392 | Disposition: A | Discharge status: Home / Self Care | Payer: Medicaid Other | Attending: Internal Medicine | Admitting: Internal Medicine

## 2019-06-08 DIAGNOSIS — Z87442 Personal history of urinary calculi: Secondary | ICD-10-CM

## 2019-06-08 DIAGNOSIS — Z20822 Contact with and (suspected) exposure to covid-19: Secondary | ICD-10-CM | POA: Diagnosis present

## 2019-06-08 DIAGNOSIS — R1013 Epigastric pain: Secondary | ICD-10-CM

## 2019-06-08 DIAGNOSIS — E872 Acidosis, unspecified: Secondary | ICD-10-CM

## 2019-06-08 DIAGNOSIS — I10 Essential (primary) hypertension: Secondary | ICD-10-CM | POA: Diagnosis present

## 2019-06-08 DIAGNOSIS — A049 Bacterial intestinal infection, unspecified: Secondary | ICD-10-CM | POA: Diagnosis not present

## 2019-06-08 DIAGNOSIS — R109 Unspecified abdominal pain: Secondary | ICD-10-CM | POA: Diagnosis not present

## 2019-06-08 DIAGNOSIS — F4310 Post-traumatic stress disorder, unspecified: Secondary | ICD-10-CM | POA: Diagnosis present

## 2019-06-08 DIAGNOSIS — Z886 Allergy status to analgesic agent status: Secondary | ICD-10-CM

## 2019-06-08 DIAGNOSIS — Z91018 Allergy to other foods: Secondary | ICD-10-CM

## 2019-06-08 DIAGNOSIS — Z7984 Long term (current) use of oral hypoglycemic drugs: Secondary | ICD-10-CM | POA: Diagnosis not present

## 2019-06-08 DIAGNOSIS — Z981 Arthrodesis status: Secondary | ICD-10-CM | POA: Diagnosis not present

## 2019-06-08 DIAGNOSIS — R112 Nausea with vomiting, unspecified: Secondary | ICD-10-CM | POA: Diagnosis not present

## 2019-06-08 DIAGNOSIS — K5900 Constipation, unspecified: Secondary | ICD-10-CM | POA: Diagnosis not present

## 2019-06-08 DIAGNOSIS — Z87891 Personal history of nicotine dependence: Secondary | ICD-10-CM | POA: Diagnosis not present

## 2019-06-08 DIAGNOSIS — A084 Viral intestinal infection, unspecified: Secondary | ICD-10-CM | POA: Diagnosis present

## 2019-06-08 DIAGNOSIS — R1033 Periumbilical pain: Secondary | ICD-10-CM

## 2019-06-08 DIAGNOSIS — I959 Hypotension, unspecified: Secondary | ICD-10-CM | POA: Diagnosis not present

## 2019-06-08 DIAGNOSIS — E876 Hypokalemia: Secondary | ICD-10-CM | POA: Diagnosis not present

## 2019-06-08 DIAGNOSIS — T48205A Adverse effect of unspecified drugs acting on muscles, initial encounter: Secondary | ICD-10-CM | POA: Diagnosis not present

## 2019-06-08 DIAGNOSIS — E119 Type 2 diabetes mellitus without complications: Secondary | ICD-10-CM | POA: Diagnosis present

## 2019-06-08 DIAGNOSIS — Z79891 Long term (current) use of opiate analgesic: Secondary | ICD-10-CM

## 2019-06-08 DIAGNOSIS — E785 Hyperlipidemia, unspecified: Secondary | ICD-10-CM | POA: Diagnosis present

## 2019-06-08 DIAGNOSIS — R000 Tachycardia, unspecified: Secondary | ICD-10-CM | POA: Diagnosis not present

## 2019-06-08 DIAGNOSIS — Z79899 Other long term (current) drug therapy: Secondary | ICD-10-CM

## 2019-06-08 DIAGNOSIS — I952 Hypotension due to drugs: Secondary | ICD-10-CM | POA: Diagnosis not present

## 2019-06-08 DIAGNOSIS — K529 Noninfective gastroenteritis and colitis, unspecified: Secondary | ICD-10-CM | POA: Diagnosis not present

## 2019-06-08 LAB — CBC
HCT: 44.7 % (ref 36.0–46.0)
Hemoglobin: 14.7 g/dL (ref 12.0–15.0)
MCH: 29.6 pg (ref 26.0–34.0)
MCHC: 32.9 g/dL (ref 30.0–36.0)
MCV: 89.9 fL (ref 80.0–100.0)
Platelets: 441 10*3/uL — ABNORMAL HIGH (ref 150–400)
RBC: 4.97 MIL/uL (ref 3.87–5.11)
RDW: 12.7 % (ref 11.5–15.5)
WBC: 19 10*3/uL — ABNORMAL HIGH (ref 4.0–10.5)
nRBC: 0 % (ref 0.0–0.2)

## 2019-06-08 LAB — BASIC METABOLIC PANEL
Anion gap: 20 — ABNORMAL HIGH (ref 5–15)
Anion gap: 12 (ref 5–15)
BUN: 10 mg/dL (ref 8–23)
BUN: 7 mg/dL — ABNORMAL LOW (ref 8–23)
CO2: 16 mmol/L — ABNORMAL LOW (ref 22–32)
CO2: 21 mmol/L — ABNORMAL LOW (ref 22–32)
Calcium: 9.8 mg/dL (ref 8.9–10.3)
Calcium: 9.1 mg/dL (ref 8.9–10.3)
Chloride: 108 mmol/L (ref 98–111)
Chloride: 112 mmol/L — ABNORMAL HIGH (ref 98–111)
Creatinine, Ser: 1.13 mg/dL — ABNORMAL HIGH (ref 0.44–1.00)
Creatinine, Ser: 0.76 mg/dL (ref 0.44–1.00)
GFR calc Af Amer: >60 mL/min (ref >60)
GFR calc Af Amer: >60 mL/min (ref >60)
GFR calc non Af Amer: 52 mL/min — ABNORMAL LOW (ref >60)
GFR calc non Af Amer: >60 mL/min (ref >60)
Glucose, Bld: 347 mg/dL — ABNORMAL HIGH (ref 70–99)
Glucose, Bld: 210 mg/dL — ABNORMAL HIGH (ref 70–99)
Potassium: 4 mmol/L (ref 3.5–5.1)
Potassium: 3.9 mmol/L (ref 3.5–5.1)
Sodium: 144 mmol/L (ref 135–145)
Sodium: 145 mmol/L (ref 135–145)

## 2019-06-08 LAB — TROPONIN I (HIGH SENSITIVITY)
Troponin I (High Sensitivity): 11 ng/L (ref <18)
Troponin I (High Sensitivity): 16 ng/L (ref <18)

## 2019-06-08 LAB — HEPATIC FUNCTION PANEL
ALT: 21 U/L (ref 0–44)
AST: 36 U/L (ref 15–41)
Albumin: 4.2 g/dL (ref 3.5–5.0)
Alkaline Phosphatase: 79 U/L (ref 38–126)
Bilirubin, Direct: 0.2 mg/dL (ref 0.0–0.2)
Indirect Bilirubin: 0.5 mg/dL (ref 0.3–0.9)
Total Bilirubin: 0.7 mg/dL (ref 0.3–1.2)
Total Protein: 7.1 g/dL (ref 6.5–8.1)

## 2019-06-08 LAB — POCT I-STAT EG7
Bicarbonate: 24.3 mmol/L (ref 20.0–28.0)
Calcium, Ion: 1.15 mmol/L (ref 1.15–1.40)
HCT: 40 % (ref 36.0–46.0)
Hemoglobin: 13.6 g/dL (ref 12.0–15.0)
O2 Saturation: 99 %
Potassium: 3.7 mmol/L (ref 3.5–5.1)
Sodium: 147 mmol/L — ABNORMAL HIGH (ref 135–145)
TCO2: 25 mmol/L (ref 22–32)
pCO2, Ven: 39 mmHg — ABNORMAL LOW (ref 44.0–60.0)
pH, Ven: 7.403 (ref 7.250–7.430)
pO2, Ven: 149 mmHg — ABNORMAL HIGH (ref 32.0–45.0)

## 2019-06-08 LAB — CBG MONITORING, ED
Glucose-Capillary: 209 mg/dL — ABNORMAL HIGH (ref 70–99)
Glucose-Capillary: 168 mg/dL — ABNORMAL HIGH (ref 70–99)

## 2019-06-08 LAB — URINALYSIS, ROUTINE W REFLEX MICROSCOPIC
Bacteria, UA: NONE SEEN
Bilirubin Urine: NEGATIVE
Glucose, UA: 150 mg/dL — AB
Ketones, ur: 5 mg/dL — AB
Leukocytes,Ua: NEGATIVE
Nitrite: NEGATIVE
Protein, ur: 30 mg/dL — AB
Specific Gravity, Urine: 1.032 — ABNORMAL HIGH (ref 1.005–1.030)
pH: 5 (ref 5.0–8.0)

## 2019-06-08 LAB — RAPID URINE DRUG SCREEN, HOSP PERFORMED
Amphetamines: NOT DETECTED
Barbiturates: NOT DETECTED
Benzodiazepines: NOT DETECTED
Cocaine: NOT DETECTED
Opiates: NOT DETECTED
Tetrahydrocannabinol: NOT DETECTED

## 2019-06-08 LAB — LACTIC ACID, PLASMA
Lactic Acid, Venous: 6.9 mmol/L (ref 0.5–1.9)
Lactic Acid, Venous: 5.5 mmol/L (ref 0.5–1.9)
Lactic Acid, Venous: 2.1 mmol/L (ref 0.5–1.9)

## 2019-06-08 LAB — LIPASE, BLOOD: Lipase: 20 U/L (ref 11–51)

## 2019-06-08 LAB — HEMOGLOBIN A1C
Hgb A1c MFr Bld: 8 % — ABNORMAL HIGH (ref 4.8–5.6)
Mean Plasma Glucose: 182.9 mg/dL

## 2019-06-08 LAB — SARS CORONAVIRUS 2 (TAT 6-24 HRS): SARS Coronavirus 2: NEGATIVE

## 2019-06-08 LAB — GLUCOSE, CAPILLARY
Glucose-Capillary: 179 mg/dL — ABNORMAL HIGH (ref 70–99)
Glucose-Capillary: 162 mg/dL — ABNORMAL HIGH (ref 70–99)

## 2019-06-08 MED ORDER — ACETAMINOPHEN 325 MG PO TABS: 650.0000 mg | ORAL_TABLET | Freq: Four times a day (QID) | ORAL | Status: DC | PRN

## 2019-06-08 MED ORDER — INSULIN ASPART 100 UNIT/ML ~~LOC~~ SOLN
0.0000 [IU] | Freq: Three times a day (TID) | SUBCUTANEOUS | Status: DC
  Administered 2019-06-09 (×3): 2 [IU] via SUBCUTANEOUS

## 2019-06-08 MED ORDER — HYDROMORPHONE HCL 1 MG/ML IJ SOLN
1.0000 mg | INTRAMUSCULAR | Status: DC | PRN
  Administered 2019-06-08 – 2019-06-09 (×7): 1 mg via INTRAVENOUS
  Filled 2019-06-08 (×7): qty 1

## 2019-06-08 MED ORDER — SODIUM CHLORIDE 0.9 % IV BOLUS
1000.0000 mL | Freq: Once | INTRAVENOUS | Status: AC
  Administered 2019-06-08: 1000 mL via INTRAVENOUS

## 2019-06-08 MED ORDER — ONDANSETRON HCL 4 MG PO TABS
4.0000 mg | ORAL_TABLET | Freq: Four times a day (QID) | ORAL | Status: DC | PRN
  Administered 2019-06-09: 4 mg via ORAL
  Filled 2019-06-08: qty 1

## 2019-06-08 MED ORDER — INSULIN ASPART 100 UNIT/ML ~~LOC~~ SOLN: 0.0000 [IU] | Freq: Every day | SUBCUTANEOUS | Status: DC

## 2019-06-08 MED ORDER — HYDROMORPHONE HCL 1 MG/ML IJ SOLN
1.0000 mg | Freq: Once | INTRAMUSCULAR | Status: AC
  Administered 2019-06-08: 1 mg via INTRAVENOUS
  Filled 2019-06-08: qty 1

## 2019-06-08 MED ORDER — ONDANSETRON HCL 4 MG/2ML IJ SOLN
4.0000 mg | Freq: Once | INTRAMUSCULAR | Status: AC
  Administered 2019-06-08: 4 mg via INTRAVENOUS
  Filled 2019-06-08: qty 2

## 2019-06-08 MED ORDER — ONDANSETRON HCL 4 MG/2ML IJ SOLN
4.0000 mg | Freq: Four times a day (QID) | INTRAMUSCULAR | Status: DC | PRN
  Administered 2019-06-08 – 2019-06-11 (×5): 4 mg via INTRAVENOUS
  Filled 2019-06-08 (×6): qty 2

## 2019-06-08 MED ORDER — FENTANYL CITRATE (PF) 100 MCG/2ML IJ SOLN
100.0000 ug | Freq: Once | INTRAMUSCULAR | Status: AC
  Administered 2019-06-08: 100 ug via INTRAVENOUS
  Filled 2019-06-08: qty 2

## 2019-06-08 MED ORDER — PIPERACILLIN-TAZOBACTAM 3.375 G IVPB 30 MIN: 3.3750 g | Freq: Once | INTRAVENOUS | Status: DC

## 2019-06-08 MED ORDER — LACTATED RINGERS IV SOLN: INTRAVENOUS | Status: DC

## 2019-06-08 MED ORDER — METOCLOPRAMIDE HCL 5 MG/ML IJ SOLN
10.0000 mg | Freq: Once | INTRAMUSCULAR | Status: AC
  Administered 2019-06-08: 10 mg via INTRAVENOUS
  Filled 2019-06-08: qty 2

## 2019-06-08 MED ORDER — IOHEXOL 300 MG/ML  SOLN
100.0000 mL | Freq: Once | INTRAMUSCULAR | Status: AC | PRN
  Administered 2019-06-08: 100 mL via INTRAVENOUS

## 2019-06-08 MED ORDER — SODIUM CHLORIDE 0.9 % IV BOLUS (SEPSIS)
1000.0000 mL | Freq: Once | INTRAVENOUS | Status: AC
  Administered 2019-06-08: 1000 mL via INTRAVENOUS

## 2019-06-08 MED ORDER — FENTANYL CITRATE (PF) 100 MCG/2ML IJ SOLN
100.0000 ug | Freq: Once | INTRAMUSCULAR | Status: AC
  Administered 2019-06-08: 07:00:00 100 ug via INTRAVENOUS
  Filled 2019-06-08: qty 2

## 2019-06-08 MED ORDER — SODIUM CHLORIDE 0.9% FLUSH
3.0000 mL | Freq: Once | INTRAVENOUS | Status: AC
  Administered 2019-06-08: 3 mL via INTRAVENOUS

## 2019-06-08 MED ORDER — ENOXAPARIN SODIUM 40 MG/0.4ML ~~LOC~~ SOLN
40.0000 mg | SUBCUTANEOUS | Status: DC
  Administered 2019-06-08 – 2019-06-12 (×4): 40 mg via SUBCUTANEOUS
  Filled 2019-06-08 (×5): qty 0.4

## 2019-06-08 MED ORDER — ACETAMINOPHEN 650 MG RE SUPP: 650.0000 mg | Freq: Four times a day (QID) | RECTAL | Status: DC | PRN

## 2019-06-08 NOTE — Progress Notes (Signed)
Pt arrived from ED with Red MEWS. Vital signs documented in flowsheets. Rapid response and MD notified. Pt A/O X 4 and resting in bed. Will continue to monitor.

## 2019-06-08 NOTE — ED Provider Notes (Signed)
[facility] EMERGENCY DEPARTMENT Provider Note   CSN: [id number] Arrival date & time: 06/08/19  0550     History Chief Complaint - abdominal pain and vomiting  Jane Murphy is a 64 y.o. female.  The history is provided by the patient.  Abdominal Pain Pain location:  Epigastric Pain quality: sharp   Pain radiates to:  Does not radiate Pain severity:  Severe Onset quality:  Gradual Timing:  Constant Progression:  Worsening Chronicity:  New Relieved by:  Nothing Worsened by:  Movement and palpation Associated symptoms: chest pain, nausea, shortness of breath and vomiting   Associated symptoms: no constipation, no diarrhea, no fever, no hematochezia, no hematuria and no melena   Patient reports approximately 24 hours ago she was feeling nauseous and she had very little breakfast.  She had nausea all day then began having abdominal pain later in the evening. After trying to go to sleep last night her nausea and pain got worse and she began having nonbloody emesis.  This also triggered some chest pain. Patient denies alcohol abuse.  Denies any symptoms of GI bleed.  She does not take NSAIDs She has never had this pain before     Past Medical History:  Diagnosis Date  . Arthritis   . Hypertension   . Kidney stone   . Osteoarthritis   . PTSD (post-traumatic stress disorder)   . Rectocele   . Shingles   . Trigger finger of right thumb     Patient Active Problem List   Diagnosis Date Noted  . Trigger finger of right thumb 04/22/2018  . Allergies 04/22/2018  . Chronic osteoarthritis 04/22/2018  . Class 2 severe obesity due to excess calories with serious comorbidity and body mass index (BMI) of 37.0 to 37.9 in adult ([place]) 04/22/2018  . Hyperlipidemia 04/22/2018    Past Surgical History:  Procedure Laterality Date  . APPENDECTOMY    . C section    . CHOLECYSTECTOMY    . HERNIA REPAIR    . KNEE SURGERY    . OOPHORECTOMY    . SPINAL FUSION    .  TONSILLECTOMY       OB History   No obstetric history on file.     No family history on file.  Social History   Tobacco Use  . Smoking status: Former Research scientist (life sciences)  . Smokeless tobacco: Never Used  Substance Use Topics  . Alcohol use: Yes    Alcohol/week: 4.0 standard drinks    Types: 4 Cans of beer per week    Comment: Occasional   . Drug use: No    Home Medications Prior to Admission medications   Medication Sig Start Date End Date Taking? Authorizing Provider  acetaminophen (TYLENOL) 500 MG tablet Take 1 tablet (500 mg total) by mouth 2 (two) times daily. Patient taking differently: Take 1,000 mg by mouth every 6 (six) hours as needed for mild pain.  04/22/18   Azzie Glatter, FNP  atorvastatin (LIPITOR) 20 MG tablet Take 1 tablet (20 mg total) by mouth daily. 04/22/18   Azzie Glatter, FNP  diazepam (VALIUM) 5 MG tablet Take 1 tablet (5 mg total) by mouth 2 (two) times daily. 11/24/18   Isla Pence, MD  fluticasone (FLONASE) 50 MCG/ACT nasal spray Place 1 spray into both nostrils daily as needed for allergies or rhinitis. 04/22/18   Azzie Glatter, FNP  HYDROcodone-acetaminophen (NORCO/VICODIN) 5-325 MG tablet Take 1 tablet by mouth every 4 (four) hours as needed. 11/24/18  Jacalyn Lefevre, MD  losartan (COZAAR) 100 MG tablet Take 100 mg by mouth daily.    [provider]  metFORMIN (GLUCOPHAGE) 500 MG tablet Take 1 tablet (500 mg total) by mouth daily. 04/22/18   Kallie Locks, FNP  ondansetron (ZOFRAN-ODT) 4 MG disintegrating tablet Take 4 mg by mouth every 8 (eight) hours as needed for nausea or vomiting.    [provider]  Phenazopyridine HCl (PYRIDIUM PO) Take 1 mg by mouth daily as needed (urine pain).    [provider]    Allergies    Aspirin, Nsaids, Other, and Food  Review of Systems   Review of Systems  Constitutional: Negative for fever.  Respiratory: Positive for shortness of breath.   Cardiovascular: Positive for chest  pain.  Gastrointestinal: Positive for abdominal pain, nausea and vomiting. Negative for constipation, diarrhea, hematochezia and melena.  Genitourinary: Negative for hematuria.  Skin: Positive for color change and rash.       Patient reports feeling flushed  All other systems reviewed and are negative.   Physical Exam Updated Vital Signs BP (!) 181/94 (BP Location: Right Arm)   Pulse (!) 160   Temp 98.3 F (36.8 C) (Oral)   Resp 18   Ht 1.676 m (5\' 6" )   Wt 80.7 kg   SpO2 99%   BMI 28.73 kg/m   Physical Exam CONSTITUTIONAL: Well developed, anxious appearing HEAD: Normocephalic/atraumatic EYES: EOMI/PERRL ENMT: Mucous membranes moist NECK: supple no meningeal signs SPINE/BACK:entire spine nontender CV: S1/S2 noted, tachycardic and regular LUNGS: Lungs are clear to auscultation bilaterally, no apparent distress ABDOMEN: soft, moderate epigastric and left upper quadrant tenderness, no rebound or guarding, bowel sounds noted throughout abdomen GU:no cva tenderness NEURO: Pt is awake/alert/appropriate, moves all extremitiesx4.  No facial droop.   EXTREMITIES: pulses normal/equalx4, full ROM SKIN: warm, flushed skin noted to chest PSYCH: no abnormalities of mood noted, alert and oriented to situation  ED Results / Procedures / Treatments   Labs (all labs ordered are listed, but only abnormal results are displayed) Labs Reviewed  CBC - Abnormal; Notable for the following components:      Result Value   WBC 19.0 (*)    Platelets 441 (*)    All other components within normal limits  BASIC METABOLIC PANEL  HEPATIC FUNCTION PANEL  LIPASE, BLOOD  LACTIC ACID, PLASMA  LACTIC ACID, PLASMA  TROPONIN I (HIGH SENSITIVITY)    EKG EKG Interpretation  Date/Time:  Thursday June 08 2019 06:01:11 EST Ventricular Rate:  148 PR Interval:    QRS Duration: 86 QT Interval:  294 QTC Calculation: 462 R Axis:   -14 Text Interpretation: Sinus tachycardia Low voltage, precordial leads  Abnormal R-wave progression, early transition Borderline T abnormalities, diffuse leads Confirmed by [id number] (Zadie Rhine) on 06/08/2019 6:05:40 AM   Radiology DG Chest Port 1 View  Result Date: 06/08/2019 CLINICAL DATA:  Shortness of breath and chest pain EXAM: PORTABLE CHEST 1 VIEW COMPARISON:  May 03, 2011 FINDINGS: The heart size and mediastinal contours are within normal limits. Aortic knob calcification. Both lungs are clear. The visualized skeletal structures are unremarkable. IMPRESSION: No active disease. Electronically Signed   By: May 05, 2011 M.D.   On: 06/08/2019 06:16    Procedures .Critical Care Performed by: 08/08/2019, MD Authorized by: Zadie Rhine, MD   Critical care provider statement:    Critical care time (minutes):  39   Critical care start time:  06/08/2019 6:31 AM   Critical care end time:  06/08/2019 7:10 AM   Critical care time was exclusive of:  Separately billable procedures and treating other patients   Critical care was necessary to treat or prevent imminent or life-threatening deterioration of the following conditions:  Cardiac failure and dehydration   Critical care was time spent personally by me on the following activities:  Pulse oximetry, re-evaluation of patient's condition, ordering and review of radiographic studies, ordering and review of laboratory studies, evaluation of patient's response to treatment, examination of patient, development of treatment plan with patient or surrogate and ordering and performing treatments and interventions   I assumed direction of critical care for this patient from another provider in my specialty: no     Medications Ordered in ED Medications  ondansetron (ZOFRAN) injection 4 mg (has no administration in time range)  fentaNYL (SUBLIMAZE) injection 100 mcg (has no administration in time range)  sodium chloride flush (NS) 0.9 % injection 3 mL (3 mLs Intravenous Given 06/08/19 0606)  fentaNYL (SUBLIMAZE)  injection 100 mcg (100 mcg Intravenous Given 06/08/19 0635)  ondansetron (ZOFRAN) injection 4 mg (4 mg Intravenous Given 06/08/19 0635)  sodium chloride 0.9 % bolus 1,000 mL (1,000 mLs Intravenous New Bag/Given 06/08/19 9532)    ED Course  I have reviewed the triage vital signs and the nursing notes.  Pertinent labs & imaging results that were available during my care of the patient were reviewed by me and considered in my medical decision making (see chart for details).    MDM Rules/Calculators/A&P                      6:32 AM Patient reports nausea over the past day with vomiting and epigastric abdominal pain. This has triggered some chest pain, but her primary issue is that abdominal pain and vomiting EMS picked up the patient and their monitor read SVT.  She was given up to 30 mg of adenosine but they think it may have infiltrated. Currently patient is having sinus tachycardia.  Will treat pain and give IV fluids. Will follow closely 7:11 AM Patient reports continued epigastric pain Denies chest pain at this time. Tachycardia improved to the 140s. Labs are pending at this time Signed out to Dr. Pilar Plate with labs/imaging pending Anticipate admission This appears to be an acute abdominal issue, lower suspicion for ACS She may have had SVT/aflutter but this improving EMS EKG strips not available Final Clinical Impression(s) / ED Diagnoses Final diagnoses:  Epigastric pain  Intractable vomiting with nausea, unspecified vomiting type  Tachycardia    Rx / DC Orders ED Discharge Orders    None       Zadie Rhine, MD 06/08/19 [id number]

## 2019-06-08 NOTE — ED Triage Notes (Signed)
Patient arrived from home with a chief complaint of chest pain that started at 2300. Pt reports projectile vomiting and pain that kept her from sleeping. She called 911 and EMS noted SVT on the monitor. The patient received 30 mg of adenosine total and asystole was reported after each administration. The patient is currently in ST.

## 2019-06-08 NOTE — ED Provider Notes (Signed)
  Provider Note MRN:  [id number]  Arrival date & time: 06/08/19    ED Course and Medical Decision Making  Assumed care from Dr. Bebe Shaggy at shift change.  Severe epigastric pain with nausea and vomiting, significant tachycardia with EMS, given multiple rounds of adenosine without effect.  Seems more likely to be sinus tachycardia, currently responding to fluids, awaiting CT imaging. Suspect need for admission.  Update: CT imaging did not show a surgical process.  Lactate was markedly elevated but improving with fluids.  Unclear etiology of patient's pain and vomiting, question of DKA given her hyperglycemia and elevated anion gap.  Admitted to hospitalist service for further care.   .Critical Care Performed by: Sabas Sous, MD Authorized by: Sabas Sous, MD   Critical care provider statement:    Critical care time (minutes):  32   Critical care was necessary to treat or prevent imminent or life-threatening deterioration of the following conditions:  Metabolic crisis   Critical care was time spent personally by me on the following activities:  Discussions with consultants, evaluation of patient's response to treatment, examination of patient, ordering and performing treatments and interventions, ordering and review of laboratory studies, ordering and review of radiographic studies, pulse oximetry, re-evaluation of patient's condition, obtaining history from patient or surrogate and review of old charts   I assumed direction of critical care for this patient from another provider in my specialty: yes      Final Clinical Impressions(s) / ED Diagnoses     ICD-10-CM   1. Epigastric pain  R10.13   2. Intractable vomiting with nausea, unspecified vomiting type  R11.2   3. Tachycardia  R00.0   4. Lactic acidosis  E87.2     ED Discharge Orders    None      Discharge Instructions   None     Elmer Sow. Pilar Plate, MD [facility] Emergency Medicine [facility]  Health [email or web address]    Sabas Sous, MD 06/08/19 1550

## 2019-06-08 NOTE — Progress Notes (Addendum)
MD Communication 03.04.21 @ 2324- on call provider notified related to patient concern for no diet order. Suggestion made by staff nurse for diet of NPO except ice chips if appropriate.   MD Communication  03.05.21 @ 5353: Nursing rounds completed. Admission history completed. Patient serves as historian. Patient discloses that she received services from [place] in [place], [place]. Will notify case management. On call provider also notified related to patient inquiry in resuming home medication regimen.

## 2019-06-08 NOTE — H&P (Signed)
Date: 06/08/2019               Patient Name:  Jane Murphy MRN: [id number]  DOB: Dec 16, 1955 Age / Sex: 64 y.o., female   PCP: System, Pcp Not In         Medical Service: Internal Medicine Teaching Service         Attending Physician: Dr. Mikey Bussing, Marthenia Rolling, DO    First Contact: Ephriam Knuckles, MD, Rylee Pager: RC [phone])  Second Contact: Cleaster Corin DO, Jaimie PagerDuane Lope [phone])       After Hours (After 5p/  First Contact Pager: [phone]  weekends / holidays): Second Contact Pager: [phone]   Chief Complaint: N/V, Abd pain  History of Present Illness: 64 y.o. yo female w/ PMH significant for nephrolithiasis, HTN, HLD, T2DM.  Presents with 24 hours of abdominal pain nausea and bilious vomiting.  She reports she warmed up the meals on wheels she received Tuesday.  She says that it tasted pecuilar and tried to give it to her dog who wouldn't eat it. She then threw it away and then after a few hours she started throwing up.  Vomiting preceded any abdominal pain by a few hours.  She attempted to take tylenol but threw this up.  She reports at least 30 episodes of vomiting at home and 5 here.  She reports just before my arrival the [employer] representative called to speak with her and reported that 6 other customers had called in with similar symptoms after eating the identical meal and that she would need to speak with her again about this issue tomorrow.  Her pain is concentrated just above her umbilicus and adjacent bilaterally worse on left.  It does not radiate to her back or elsewhere.  She says it has improved since being here. She reports soft formed bowel movement daily without straining, last bm yesterday morning.  Initially pain worse when throwing up and then became more constant at which time she noticed her heart rate was increasing.  Before this incident no early satiety or difficulty eating she has had purposeful weight loss due to dietary changes and getting a rescue  dog that she walks.  Her main concern was her pain and the associated increased heart rate so she called EMS.  Apparently there was concern for SVT and several rounds of adenosine were given en route but unsure if IV was working correctly.     Meds:  Current Meds  Medication Sig  . acetaminophen (TYLENOL) 500 MG tablet Take 1 tablet (500 mg total) by mouth 2 (two) times daily. (Patient taking differently: Take 1,000 mg by mouth every 6 (six) hours as needed for mild pain. )  . atorvastatin (LIPITOR) 10 MG tablet Take 10 mg by mouth daily.  . fluticasone (FLONASE) 50 MCG/ACT nasal spray Place 1 spray into both nostrils daily as needed for allergies or rhinitis.  Marland Kitchen losartan (COZAAR) 100 MG tablet Take 100 mg by mouth daily.  . metFORMIN (GLUCOPHAGE) 500 MG tablet Take 1 tablet (500 mg total) by mouth daily.  Marland Kitchen tiZANidine (ZANAFLEX) 4 MG tablet Take 4 mg by mouth every 6 (six) hours as needed for muscle spasms.     Allergies: Allergies as of 06/08/2019 - Review Complete 06/08/2019  Allergen Reaction Noted  . Aspirin Anaphylaxis 05/03/2011  . Nsaids Anaphylaxis 05/03/2011  . Other Anaphylaxis 05/03/2011  . Food Hives and Other (See Comments) 05/03/2011   Past Medical History:  Diagnosis Date  .  Arthritis   . Hypertension   . Kidney stone   . Osteoarthritis   . PTSD (post-traumatic stress disorder)   . Rectocele   . Shingles   . Trigger finger of right thumb     Family History: No family history on file.   Social History:  Social History   Tobacco Use  . Smoking status: Former Research scientist (life sciences)  . Smokeless tobacco: Never Used  Substance Use Topics  . Alcohol use: Yes    Alcohol/week: 4.0 standard drinks    Types: 4 Cans of beer per week    Comment: Occasional   . Drug use: No    denies alcohol use in 3 years No cigarette smoking since she was 64 years old denies vaping or recreational drug use  Review of Systems: A complete ROS was negative except as per HPI.   Physical  Exam: Blood pressure (!) 161/98, pulse (!) 148, temperature 98.3 F (36.8 C), temperature source Oral, resp. rate (!) 24, height 5\' 6"  (1.676 m), weight 80.7 kg, SpO2 92 %. Physical Exam Constitutional:      General: She is not in acute distress.    Appearance: She is not diaphoretic.  HENT:     Head: Normocephalic and atraumatic.     Mouth/Throat:     Mouth: Mucous membranes are dry.  Cardiovascular:     Rate and Rhythm: Regular rhythm. Tachycardia present.     Heart sounds: Normal heart sounds. No murmur. No friction rub. No gallop.   Pulmonary:     Effort: Pulmonary effort is normal. No respiratory distress.     Breath sounds: Normal breath sounds. No wheezing or rales.  Chest:     Chest wall: No tenderness.  Abdominal:     General: Bowel sounds are increased. There is no distension.     Palpations: Abdomen is soft. There is no mass.     Tenderness: There is abdominal tenderness in the periumbilical area. There is no guarding or rebound.  Musculoskeletal:     Right lower leg: No edema.     Left lower leg: No edema.  Skin:    General: Skin is warm and dry.  Neurological:     Mental Status: She is alert.     EKG: personally reviewed my interpretation is sinus tachycardia  CXR: personally reviewed my interpretation is no acute cardiopulmonary disease   Assessment & Plan by Problem: Active Problems:   Intractable nausea and vomiting  Intractable N/V, abdominal pain: no acute findings on CT with contrast, given history this could be severe food poisoning from bacterial toxin and possibly it's early enough no findings on CT.  Lactate elevated but based on her history, imaging and exam findings my suspicion for mesenteric ischemia is low.  Given the timeline and that this is likely toxin driven she is unlikely to benefit from antibiotic therapy.    -supportive care with pain control, antiemetics and IVF -telemetry and monitor in progressive until pain control and  stabilization achieved -npo for now   Anion gap Metabolic Acidosis: a lactic acidosis, likely 2/2 vomiting with fluid loss and muscle use, pt also on metformin  -expect this to improve with supportive care -follow lactate and bicarbonate  T2DM: she reports excellent control on metformin alone at home  -check A1C -SSI for now as pt cbg likely exaggerated by stress response  Dispo: Admit patient to Inpatient with expected length of stay greater than 2 midnights.  Signed: Katherine Roan, MD 06/08/2019, 12:15 PM

## 2019-06-08 NOTE — ED Notes (Signed)
Date and time results received: 06/08/19 742 (use smartphrase ".now" to insert current time)  Test: lactic Critical Value: 6.9  Name of Provider Notified: Bero  Orders Received? Or Actions Taken?: .

## 2019-06-09 LAB — COMPREHENSIVE METABOLIC PANEL
ALT: 17 U/L (ref 0–44)
AST: 22 U/L (ref 15–41)
Albumin: 3.9 g/dL (ref 3.5–5.0)
Alkaline Phosphatase: 67 U/L (ref 38–126)
Anion gap: 11 (ref 5–15)
BUN: <5 mg/dL — ABNORMAL LOW (ref 8–23)
CO2: 28 mmol/L (ref 22–32)
Calcium: 9.4 mg/dL (ref 8.9–10.3)
Chloride: 106 mmol/L (ref 98–111)
Creatinine, Ser: 0.76 mg/dL (ref 0.44–1.00)
GFR calc Af Amer: >60 mL/min (ref >60)
GFR calc non Af Amer: >60 mL/min (ref >60)
Glucose, Bld: 166 mg/dL — ABNORMAL HIGH (ref 70–99)
Potassium: 3.3 mmol/L — ABNORMAL LOW (ref 3.5–5.1)
Sodium: 145 mmol/L (ref 135–145)
Total Bilirubin: 0.9 mg/dL (ref 0.3–1.2)
Total Protein: 6.8 g/dL (ref 6.5–8.1)

## 2019-06-09 LAB — CBC
HCT: 39.1 % (ref 36.0–46.0)
Hemoglobin: 13.1 g/dL (ref 12.0–15.0)
MCH: 29.7 pg (ref 26.0–34.0)
MCHC: 33.5 g/dL (ref 30.0–36.0)
MCV: 88.7 fL (ref 80.0–100.0)
Platelets: 341 10*3/uL (ref 150–400)
RBC: 4.41 MIL/uL (ref 3.87–5.11)
RDW: 13.1 % (ref 11.5–15.5)
WBC: 18.1 10*3/uL — ABNORMAL HIGH (ref 4.0–10.5)
nRBC: 0 % (ref 0.0–0.2)

## 2019-06-09 LAB — HIV ANTIBODY (ROUTINE TESTING W REFLEX): HIV Screen 4th Generation wRfx: NONREACTIVE

## 2019-06-09 LAB — GLUCOSE, CAPILLARY
Glucose-Capillary: 154 mg/dL — ABNORMAL HIGH (ref 70–99)
Glucose-Capillary: 175 mg/dL — ABNORMAL HIGH (ref 70–99)
Glucose-Capillary: 160 mg/dL — ABNORMAL HIGH (ref 70–99)
Glucose-Capillary: 194 mg/dL — ABNORMAL HIGH (ref 70–99)

## 2019-06-09 MED ORDER — LOSARTAN POTASSIUM 50 MG PO TABS
100.0000 mg | ORAL_TABLET | Freq: Every day | ORAL | Status: DC
  Administered 2019-06-09 – 2019-06-12 (×4): 100 mg via ORAL
  Filled 2019-06-09 (×4): qty 2

## 2019-06-09 MED ORDER — HYDROMORPHONE HCL 1 MG/ML IJ SOLN
0.5000 mg | INTRAMUSCULAR | Status: DC | PRN
  Administered 2019-06-09 – 2019-06-10 (×3): 0.5 mg via INTRAVENOUS
  Filled 2019-06-09 (×3): qty 1

## 2019-06-09 NOTE — Plan of Care (Signed)
  Problem: Health Behavior/Discharge Planning: Goal: Ability to manage health-related needs will improve Outcome: Progressing   Problem: Clinical Measurements: Goal: Ability to maintain clinical measurements within normal limits will improve Outcome: Progressing Goal: Will remain free from infection Outcome: Progressing Goal: Diagnostic test results will improve Outcome: Progressing Goal: Respiratory complications will improve Outcome: Progressing Goal: Cardiovascular complication will be avoided Outcome: Progressing   Problem: Education: Goal: Individualized Educational Video(s) Outcome: Progressing   Problem: Fluid Volume: Goal: Ability to maintain a balanced intake and output will improve Outcome: Progressing   Problem: Health Behavior/Discharge Planning: Goal: Ability to identify and utilize available resources and services will improve Outcome: Progressing Goal: Ability to manage health-related needs will improve Outcome: Progressing

## 2019-06-09 NOTE — Progress Notes (Addendum)
   NAMERefugia Murphy, MRN:  [id number], DOB:  [birth year], LOS: 1 ADMISSION DATE:  06/08/2019  Subjective  Six other people as well with salmonella. She is not having nausea after taking the zofran. She does continue to have some abdominal pain. She is no longer having chest pain. She is passing gas.   Objective   Blood pressure (!) 163/96, pulse (!) 125, temperature 98.7 F (37.1 C), temperature source Oral, resp. rate 20, height 5\' 7"  (1.702 m), weight 92.1 kg, SpO2 97 %.     Intake/Output Summary (Last 24 hours) at 06/09/2019 0550 Last data filed at 06/09/2019 0214 Gross per 24 hour  Intake 2499.21 ml  Output 700 ml  Net 1799.21 ml   Filed Weights   06/08/19 0559 06/08/19 1752  Weight: 80.7 kg 92.1 kg    Examination: GENERAL: in no acute distress CARDIAC: heart RRR. No peripheral edema.  PULMONARY: acyanotic. Lung sounds clear to auscultation. ABDOMEN: soft. +epigastric pain.  Nondistended.  SKIN: no rash or lesions on limited exam   Significant Diagnostic Tests:  3/4 CT A/P: no acute findings  Labs    CBC Latest Ref Rng & Units 06/08/2019 06/08/2019 11/24/2018  WBC 4.0 - 10.5 K/uL - 19.0(H) 14.6(H)  Hemoglobin 12.0 - 15.0 g/dL 11/26/2018 29.5 62.1  Hematocrit 36.0 - 46.0 % 40.0 44.7 41.1  Platelets 150 - 400 K/uL - 441(H) 272   BMP Latest Ref Rng & Units 06/08/2019 06/08/2019 06/08/2019  Glucose 70 - 99 mg/dL 08/08/2019) - 657(Q)  BUN 8 - 23 mg/dL 7(L) - 10  Creatinine 469(G - 1.00 mg/dL 2.95 - 2.84)  BUN/Creat Ratio 12 - 28 - - -  Sodium 135 - 145 mmol/L 145 147(H) 144  Potassium 3.5 - 5.1 mmol/L 3.9 3.7 4.0  Chloride 98 - 111 mmol/L 112(H) - 108  CO2 22 - 32 mmol/L 21(L) - 16(L)  Calcium 8.9 - 10.3 mg/dL 9.1 - 9.8    Summary  64 yo female presenting with irretractable n/v and abdominal pain that started 1h following food injestion suspected to be attributable to food poisioning from bacterial toxin.  Assessment & Plan:  Active Problems:   Intractable nausea and vomiting   Bacterial toxin gastroenteritis with irretractable n/v and abdominal pain. Bacterial type remains unclear. Patient reports that she was called by someone at [employer] who told her it was salmonella however upon attempting to contact them, unable to get a clear answer. Since her symptoms are rapidly improving and she has not had diarrhea,  Im not entirely convinced this is a bacterial infection. If she continues to improve, I would think it would be safe to forego further workup unless she develops diarrhea or worsening sx.  No recorded emesis output since admission Tachycardia and blood pressure improving. Leukocytosis most likely reactive. Clear liquid diet started during rounds this morning since she was doing well. Tolerated this well. Suspect that she should be able to discharge home later today or tomorrow if she is tolerating ADAT Plan: IVF. Advance diet to full liquid. Pain control. Tele monitoring.  AGMA. Lactate elevated on admission. No repeat but suspect etiology of hypovolemia from vomitting. Plan: continue to hold metformin. Will continue to monitor.  T2DM. SSI. Continue to hold metformin.   Best practice:  CODE STATUS: Full Diet: full liquid DVT for prophylaxis: lovenox Dispo: home in 0-1d   77, MD INTERNAL MEDICINE RESIDENT PGY-1 PAGER #: [phone] 06/09/19  5:50 AM

## 2019-06-09 NOTE — Progress Notes (Addendum)
Attempted to reach out to her [facility] [employer] [facility] branches to confirm that the illness was Salmonella as the patient reports. Unfortunately, unable to contact the coordinator at either of the below offices. HIPPA compliant VM left at both with reason for calling and call back number.  [facility]: [phone] [place] [facility]: [phone]

## 2019-06-10 DIAGNOSIS — E876 Hypokalemia: Secondary | ICD-10-CM

## 2019-06-10 DIAGNOSIS — R000 Tachycardia, unspecified: Secondary | ICD-10-CM

## 2019-06-10 DIAGNOSIS — A049 Bacterial intestinal infection, unspecified: Secondary | ICD-10-CM

## 2019-06-10 DIAGNOSIS — R1013 Epigastric pain: Secondary | ICD-10-CM

## 2019-06-10 DIAGNOSIS — Z79899 Other long term (current) drug therapy: Secondary | ICD-10-CM

## 2019-06-10 LAB — CBC
HCT: 38.4 % (ref 36.0–46.0)
Hemoglobin: 12.6 g/dL (ref 12.0–15.0)
MCH: 29.8 pg (ref 26.0–34.0)
MCHC: 32.8 g/dL (ref 30.0–36.0)
MCV: 90.8 fL (ref 80.0–100.0)
Platelets: 261 10*3/uL (ref 150–400)
RBC: 4.23 MIL/uL (ref 3.87–5.11)
RDW: 12.8 % (ref 11.5–15.5)
WBC: 12.8 10*3/uL — ABNORMAL HIGH (ref 4.0–10.5)
nRBC: 0 % (ref 0.0–0.2)

## 2019-06-10 LAB — BASIC METABOLIC PANEL
Anion gap: 9 (ref 5–15)
BUN: <5 mg/dL — ABNORMAL LOW (ref 8–23)
CO2: 30 mmol/L (ref 22–32)
Calcium: 9 mg/dL (ref 8.9–10.3)
Chloride: 102 mmol/L (ref 98–111)
Creatinine, Ser: 0.75 mg/dL (ref 0.44–1.00)
GFR calc Af Amer: >60 mL/min (ref >60)
GFR calc non Af Amer: >60 mL/min (ref >60)
Glucose, Bld: 173 mg/dL — ABNORMAL HIGH (ref 70–99)
Potassium: 2.9 mmol/L — ABNORMAL LOW (ref 3.5–5.1)
Sodium: 141 mmol/L (ref 135–145)

## 2019-06-10 LAB — GLUCOSE, CAPILLARY
Glucose-Capillary: 215 mg/dL — ABNORMAL HIGH (ref 70–99)
Glucose-Capillary: 155 mg/dL — ABNORMAL HIGH (ref 70–99)
Glucose-Capillary: 232 mg/dL — ABNORMAL HIGH (ref 70–99)
Glucose-Capillary: 95 mg/dL (ref 70–99)

## 2019-06-10 MED ORDER — SIMETHICONE 80 MG PO CHEW
80.0000 mg | CHEWABLE_TABLET | Freq: Two times a day (BID) | ORAL | Status: DC
  Administered 2019-06-10 (×2): 80 mg via ORAL
  Filled 2019-06-10 (×2): qty 1

## 2019-06-10 MED ORDER — SIMETHICONE 80 MG PO CHEW: 80.0000 mg | CHEWABLE_TABLET | Freq: Four times a day (QID) | ORAL | Status: DC | PRN

## 2019-06-10 MED ORDER — INSULIN ASPART 100 UNIT/ML ~~LOC~~ SOLN
0.0000 [IU] | Freq: Three times a day (TID) | SUBCUTANEOUS | Status: DC
  Administered 2019-06-10: 7 [IU] via SUBCUTANEOUS
  Administered 2019-06-11 – 2019-06-12 (×3): 4 [IU] via SUBCUTANEOUS

## 2019-06-10 MED ORDER — HYDROMORPHONE HCL 1 MG/ML IJ SOLN
1.0000 mg | INTRAMUSCULAR | Status: DC | PRN
  Administered 2019-06-10 – 2019-06-11 (×4): 1 mg via INTRAVENOUS
  Filled 2019-06-10 (×4): qty 1

## 2019-06-10 MED ORDER — LACTATED RINGERS IV BOLUS: 500.0000 mL | Freq: Once | INTRAVENOUS | Status: DC

## 2019-06-10 MED ORDER — PANTOPRAZOLE SODIUM 20 MG PO TBEC
20.0000 mg | DELAYED_RELEASE_TABLET | Freq: Every day | ORAL | Status: DC
  Administered 2019-06-10 – 2019-06-12 (×3): 20 mg via ORAL
  Filled 2019-06-10 (×3): qty 1

## 2019-06-10 MED ORDER — POTASSIUM CHLORIDE CRYS ER 20 MEQ PO TBCR
40.0000 meq | EXTENDED_RELEASE_TABLET | ORAL | Status: AC
  Administered 2019-06-10 (×2): 40 meq via ORAL
  Filled 2019-06-10 (×2): qty 2

## 2019-06-10 MED ORDER — SIMETHICONE 80 MG PO CHEW: 80.0000 mg | CHEWABLE_TABLET | Freq: Three times a day (TID) | ORAL | Status: DC

## 2019-06-10 MED ORDER — AMLODIPINE BESYLATE 5 MG PO TABS
5.0000 mg | ORAL_TABLET | Freq: Every day | ORAL | Status: DC
  Administered 2019-06-10 – 2019-06-12 (×3): 5 mg via ORAL
  Filled 2019-06-10 (×3): qty 1

## 2019-06-10 MED ORDER — INSULIN ASPART 100 UNIT/ML ~~LOC~~ SOLN
0.0000 [IU] | Freq: Three times a day (TID) | SUBCUTANEOUS | Status: DC
  Administered 2019-06-10: 5 [IU] via SUBCUTANEOUS
  Administered 2019-06-10: 3 [IU] via SUBCUTANEOUS

## 2019-06-10 MED ORDER — INSULIN ASPART 100 UNIT/ML ~~LOC~~ SOLN: 0.0000 [IU] | Freq: Every day | SUBCUTANEOUS | Status: DC

## 2019-06-10 MED ORDER — SODIUM CHLORIDE 0.9 % IV BOLUS: 500.0000 mL | Freq: Once | INTRAVENOUS | Status: DC

## 2019-06-10 MED ORDER — SENNOSIDES-DOCUSATE SODIUM 8.6-50 MG PO TABS
1.0000 | ORAL_TABLET | Freq: Two times a day (BID) | ORAL | Status: AC
  Administered 2019-06-10 (×2): 1 via ORAL
  Filled 2019-06-10 (×2): qty 1

## 2019-06-10 NOTE — Progress Notes (Signed)
Patient's heart rate increasing to the 140-150's upon standing, blood pressure reading 173/105 @ 0433, manual blood pressure obtained 172/90. On-call MD for Internal Medicine paged. Awaiting orders.   Bari Edward, RN

## 2019-06-10 NOTE — Evaluation (Signed)
Physical Therapy Evaluation Patient Details Name: Jane Murphy MRN: [id number] DOB: 05-May-1955 Today's Date: 06/10/2019   History of Present Illness  64 y.o. yo female w/ PMH significant for nephrolithiasis, HTN, HLD, T2DM.  Presents with 24 hours of abdominal pain nausea and bilious vomiting.Testing is positive for salmonella from [employer] meal.    Clinical Impression  Pt admitted with above diagnosis. PTA pt lived alone, independent. On eval, she demonstrated modified independence with bed mobility and transfers. Supervision provided for ambulation 200' without AD. Gait distance limited by tachycardia. Max HR 155. Pt currently with functional limitations due to the deficits listed below (see PT Problem List). Pt will benefit from skilled PT to increase their independence and safety with mobility to allow discharge to the venue listed below.  PT to follow acutely to address activity tolerance, gait and stairs. No follow up services indicated.     Follow Up Recommendations No PT follow up    Equipment Recommendations  None recommended by PT    Recommendations for Other Services       Precautions / Restrictions Precautions Precautions: Other (comment) Precaution Comments: watch HR Restrictions Weight Bearing Restrictions: No      Mobility  Bed Mobility Overal bed mobility: Modified Independent                Transfers Overall transfer level: Modified independent                  Ambulation/Gait Ambulation/Gait assistance: Supervision Gait Distance (Feet): 200 Feet Assistive device: None Gait Pattern/deviations: WFL(Within Functional Limits) Gait velocity: WFL   General Gait Details: max HR 155 during mobility. Cues for rest breaks, decreased speed and deep breathing kept HR around 144.  Stairs            Wheelchair Mobility    Modified Rankin (Stroke Patients Only)       Balance Overall balance assessment: Mild deficits observed,  not formally tested                                           Pertinent Vitals/Pain Pain Assessment: Faces Faces Pain Scale: Hurts a little bit Pain Location: epigastric pain Pain Descriptors / Indicators: Aching;Sore Pain Intervention(s): Premedicated before session;Monitored during session    Home Living Family/patient expects to be discharged to:: Private residence Living Arrangements: Alone Available Help at Discharge: Family Type of Home: Apartment Home Access: Level entry     Home Layout: One level Home Equipment: Cane - single point;Walker - 2 wheels;Bedside commode;Tub bench Additional Comments: service dog    Prior Function Level of Independence: Independent         Comments: normally walks several miles with service dog, walks to grocery store; uses cane only in incliment weather     Hand Dominance        Extremity/Trunk Assessment   Upper Extremity Assessment Upper Extremity Assessment: Overall WFL for tasks assessed    Lower Extremity Assessment Lower Extremity Assessment: Overall WFL for tasks assessed    Cervical / Trunk Assessment Cervical / Trunk Assessment: Normal  Communication   Communication: No difficulties  Cognition Arousal/Alertness: Awake/alert Behavior During Therapy: WFL for tasks assessed/performed Overall Cognitive Status: Within Functional Limits for tasks assessed  General Comments General comments (skin integrity, edema, etc.): tachy with all mobility.    Exercises     Assessment/Plan    PT Assessment Patient needs continued PT services  PT Problem List Decreased mobility;Cardiopulmonary status limiting activity;Pain;Decreased activity tolerance       PT Treatment Interventions Therapeutic activities;Gait training;Therapeutic exercise;Patient/family education;Stair training;Balance training;Functional mobility training    PT Goals (Current  goals can be found in the Care Plan section)  Acute Rehab PT Goals Patient Stated Goal: get better and return home PT Goal Formulation: With patient Time For Goal Achievement: 06/17/19 Potential to Achieve Goals: Good    Frequency Min 3X/week   Barriers to discharge        Co-evaluation   Reason for Co-Treatment: To address functional/ADL transfers   OT goals addressed during session: ADL's and self-care       AM-PAC PT "6 Clicks" Mobility  Outcome Measure Help needed turning from your back to your side while in a flat bed without using bedrails?: None Help needed moving from lying on your back to sitting on the side of a flat bed without using bedrails?: None Help needed moving to and from a bed to a chair (including a wheelchair)?: None Help needed standing up from a chair using your arms (e.g., wheelchair or bedside chair)?: None Help needed to walk in hospital room?: None Help needed climbing 3-5 steps with a railing? : A Little 6 Click Score: 23    End of Session Equipment Utilized During Treatment: Gait belt Activity Tolerance: Patient tolerated treatment well Patient left: in bed;with call bell/phone within reach Nurse Communication: Mobility status PT Visit Diagnosis: Difficulty in walking, not elsewhere classified (R26.2)    Time: 1010-1031 PT Time Calculation (min) (ACUTE ONLY): 21 min   Charges:   PT Evaluation $PT Eval Low Complexity: 1 Low          Lorrin Goodell, PT  Office # [phone] Pager [phone]   Lorriane Shire 06/10/2019, 11:38 AM

## 2019-06-10 NOTE — Progress Notes (Addendum)
   Subjective:   She is feeling worse today. She denies nausea but endorses increased abdominal pain. She has not had a BM since admission but is passing a lot of gas. She normally takes miralax and dulcolax at home daily. She is requesting to try to eat some crackers.  She is also having hiccups which won't resolve.   Objective:  Vital signs in last 24 hours: Vitals:   06/09/19 1953 06/10/19 0008 06/10/19 0433 06/10/19 0437  BP: (!) 158/97 (!) 167/100 (!) 173/105 (!) 172/90  Pulse: (!) 105 (!) 104    Resp: 20 18 20    Temp: 97.9 F (36.6 C) 99.4 F (37.4 C) 98.5 F (36.9 C)   TempSrc: Axillary Oral Oral   SpO2: 98% 94% 95%   Weight:   93.7 kg   Height:        Constitution: NAD, appears stated age Cardio: tachycardic, regular rhythm, no m/r/g, no LE edema  Respiratory: CTA, no w/r/r Abdominal: soft, non-distended, TTP epigastric region MSK: moving all extremities Neuro: normal affect, a&ox3 Skin: c/d/i   Assessment/Plan:  Active Problems:   Intractable nausea and vomiting   64yo female with PMH HTN, TIIDM presenting with intractable nausea and vomiting thought to be secondary to food poisoning.   Gastroenteritis 2/2 bacterial toxin Patient feeling worse today with abdominal pain although denies nausea or vomiting. Leukocytosis resolving. Diet was advanced yesterday to FLD, she is still requesting to eat. She has not had a BM since prior to admission. Leukocytosis resolving. Case discussed with [facility] and they have not had any other clients report illness. She also continues to have tachycardia with ambulation which may be secondary to prolonged resolution. Will continue current symptoms management, if symptoms do not improve will consider gastroparesis with TIIDM.   - cont. IV hydration - add simethicone, scheduled sennokot, miralax  - zofran prn nausea, dilaudid prn abdominal pain  - advance diet as tolerated  - ekg  - orthostatic vital signs  - PT/OT    Hypokelamia 2/2 fluid repletion and decreased po intake - replete K  TIIDM  Cont. SSI   HTN - cont. Losartan 100 mg qd  VTE: lovenox IVF: NS Diet: advance as tolerated Code: full   Dispo: Anticipated discharge pending symptom improvement.   , DO 06/10/2019, 6:43 AM Pager: [phone]

## 2019-06-10 NOTE — Evaluation (Signed)
Occupational Therapy Evaluation Patient Details Name: Jane Murphy MRN: [id number] DOB: 1955/07/17 Today's Date: 06/10/2019    History of Present Illness 64 y.o. yo female w/ PMH significant for nephrolithiasis, HTN, HLD, T2DM.  Presents with 24 hours of abdominal pain nausea and bilious vomiting.Testing is positive for salmonella from meals on wheels meal.   Clinical Impression   PTA pt independent for BADL and in community. At time of eval, she presents very close to baseline for functional mobility and at baseline level for BADLs. Pt completed bed mobility and functional mobility a household distance without assist. She does remain somewhat limited by her HR, which was up to 155 bpm with activity. Pt cued for rest breaks and deep breathing which kept HR ~144 bpm. Once back in bed HR was in 130s bpm. Given current status, no further follow up OT is indicated. Pt has DME as needed, usually uses shower chair. OT will sign off. Thank you for this consult.     Follow Up Recommendations  No OT follow up    Equipment Recommendations  None recommended by OT    Recommendations for Other Services       Precautions / Restrictions Precautions Precautions: Other (comment) Precaution Comments: watch HR Restrictions Weight Bearing Restrictions: No      Mobility Bed Mobility Overal bed mobility: Modified Independent                Transfers Overall transfer level: Modified independent                    Balance Overall balance assessment: Mild deficits observed, not formally tested                                         ADL either performed or assessed with clinical judgement   ADL Overall ADL's : Modified independent;At baseline                                       General ADL Comments: Demonstrates ability to complete all BADL at baseline level and without increased need for assist. Pt completed household level of functional  mobility without assistance and toilet transfer.     Vision Baseline Vision/History: Wears glasses Wears Glasses: At all times Patient Visual Report: No change from baseline       Perception     Praxis      Pertinent Vitals/Pain Pain Assessment: Faces Faces Pain Scale: Hurts a little bit Pain Location: epigastric pain Pain Descriptors / Indicators: Aching;Sore Pain Intervention(s): Monitored during session     Hand Dominance     Extremity/Trunk Assessment Upper Extremity Assessment Upper Extremity Assessment: Overall WFL for tasks assessed   Lower Extremity Assessment Lower Extremity Assessment: Defer to PT evaluation       Communication Communication Communication: No difficulties   Cognition Arousal/Alertness: Awake/alert Behavior During Therapy: WFL for tasks assessed/performed Overall Cognitive Status: Within Functional Limits for tasks assessed                                     General Comments       Exercises     Shoulder Instructions      Home Living Family/patient expects to be discharged  to:: Private residence Living Arrangements: Alone Available Help at Discharge: Family Type of Home: Apartment Home Access: Level entry     Home Layout: One level     Bathroom Shower/Tub: Chief Strategy Officer: Standard Bathroom Accessibility: No   Home Equipment: Cane - single point;Walker - 2 wheels;Bedside commode;Tub bench   Additional Comments: service dog      Prior Functioning/Environment Level of Independence: Independent        Comments: normally walks several miles with service dog, walks to grocery store; uses cane only in incliment weather        OT Problem List: Pain;Decreased activity tolerance;Decreased knowledge of use of DME or AE      OT Treatment/Interventions:      OT Goals(Current goals can be found in the care plan section) Acute Rehab OT Goals Patient Stated Goal: get better and return  home OT Goal Formulation: With patient Time For Goal Achievement: 06/24/19 Potential to Achieve Goals: Good  OT Frequency:     Barriers to D/C:            Co-evaluation PT/OT/SLP Co-Evaluation/Treatment: Yes Reason for Co-Treatment: To address functional/ADL transfers   OT goals addressed during session: ADL's and self-care      AM-PAC OT "6 Clicks" Daily Activity     Outcome Measure Help from another person eating meals?: None Help from another person taking care of personal grooming?: None Help from another person toileting, which includes using toliet, bedpan, or urinal?: None Help from another person bathing (including washing, rinsing, drying)?: None Help from another person to put on and taking off regular upper body clothing?: None Help from another person to put on and taking off regular lower body clothing?: None 6 Click Score: 24   End of Session Equipment Utilized During Treatment: Gait belt Nurse Communication: Mobility status  Activity Tolerance: Patient tolerated treatment well Patient left: in bed;with call bell/phone within reach  OT Visit Diagnosis: Pain Pain - part of body: (abdomen)                Time: 1010-1029 OT Time Calculation (min): 19 min Charges:  OT General Charges $OT Visit: 1 Visit OT Evaluation $OT Eval Low Complexity: 1 Low  Dalphine Handing, MSOT, OTR/L Acute Rehabilitation Services [facility] Office Number: [phone] Pager: [phone]  Dalphine Handing 06/10/2019, 10:36 AM

## 2019-06-11 DIAGNOSIS — R1013 Epigastric pain: Secondary | ICD-10-CM

## 2019-06-11 DIAGNOSIS — T48205A Adverse effect of unspecified drugs acting on muscles, initial encounter: Secondary | ICD-10-CM

## 2019-06-11 DIAGNOSIS — I952 Hypotension due to drugs: Secondary | ICD-10-CM

## 2019-06-11 LAB — CBC WITH DIFFERENTIAL/PLATELET
Abs Immature Granulocytes: 0.04 10*3/uL (ref 0.00–0.07)
Basophils Absolute: 0.1 10*3/uL (ref 0.0–0.1)
Basophils Relative: 1 %
Eosinophils Absolute: 0.2 10*3/uL (ref 0.0–0.5)
Eosinophils Relative: 2 %
HCT: 40 % (ref 36.0–46.0)
Hemoglobin: 13.4 g/dL (ref 12.0–15.0)
Immature Granulocytes: 0 %
Lymphocytes Relative: 43 %
Lymphs Abs: 4.6 10*3/uL — ABNORMAL HIGH (ref 0.7–4.0)
MCH: 29.8 pg (ref 26.0–34.0)
MCHC: 33.5 g/dL (ref 30.0–36.0)
MCV: 89.1 fL (ref 80.0–100.0)
Monocytes Absolute: 0.9 10*3/uL (ref 0.1–1.0)
Monocytes Relative: 8 %
Neutro Abs: 4.9 10*3/uL (ref 1.7–7.7)
Neutrophils Relative %: 46 %
Platelets: 266 10*3/uL (ref 150–400)
RBC: 4.49 MIL/uL (ref 3.87–5.11)
RDW: 12.6 % (ref 11.5–15.5)
WBC: 10.7 10*3/uL — ABNORMAL HIGH (ref 4.0–10.5)
nRBC: 0 % (ref 0.0–0.2)

## 2019-06-11 LAB — BASIC METABOLIC PANEL
Anion gap: 13 (ref 5–15)
BUN: 5 mg/dL — ABNORMAL LOW (ref 8–23)
CO2: 26 mmol/L (ref 22–32)
Calcium: 9.2 mg/dL (ref 8.9–10.3)
Chloride: 103 mmol/L (ref 98–111)
Creatinine, Ser: 0.75 mg/dL (ref 0.44–1.00)
GFR calc Af Amer: >60 mL/min (ref >60)
GFR calc non Af Amer: >60 mL/min (ref >60)
Glucose, Bld: 153 mg/dL — ABNORMAL HIGH (ref 70–99)
Potassium: 3.7 mmol/L (ref 3.5–5.1)
Sodium: 142 mmol/L (ref 135–145)

## 2019-06-11 LAB — GLUCOSE, CAPILLARY
Glucose-Capillary: 140 mg/dL — ABNORMAL HIGH (ref 70–99)
Glucose-Capillary: 154 mg/dL — ABNORMAL HIGH (ref 70–99)
Glucose-Capillary: 163 mg/dL — ABNORMAL HIGH (ref 70–99)
Glucose-Capillary: 106 mg/dL — ABNORMAL HIGH (ref 70–99)
Glucose-Capillary: 133 mg/dL — ABNORMAL HIGH (ref 70–99)

## 2019-06-11 MED ORDER — BISACODYL 5 MG PO TBEC
10.0000 mg | DELAYED_RELEASE_TABLET | Freq: Every day | ORAL | Status: DC | PRN
  Administered 2019-06-11: 10 mg via ORAL
  Filled 2019-06-11: qty 2

## 2019-06-11 MED ORDER — ALUM & MAG HYDROXIDE-SIMETH 200-200-20 MG/5ML PO SUSP
30.0000 mL | Freq: Once | ORAL | Status: AC
  Administered 2019-06-11: 30 mL via ORAL
  Filled 2019-06-11: qty 30

## 2019-06-11 MED ORDER — LIDOCAINE VISCOUS HCL 2 % MT SOLN
15.0000 mL | Freq: Once | OROMUCOSAL | Status: AC
  Administered 2019-06-11: 15 mL via ORAL
  Filled 2019-06-11: qty 15

## 2019-06-11 MED ORDER — BISACODYL 10 MG RE SUPP: 10.0000 mg | Freq: Every day | RECTAL | Status: DC | PRN

## 2019-06-11 MED ORDER — CYCLOBENZAPRINE HCL 5 MG PO TABS
7.5000 mg | ORAL_TABLET | Freq: Three times a day (TID) | ORAL | Status: DC | PRN
  Filled 2019-06-11: qty 1.5

## 2019-06-11 MED ORDER — GLYCERIN (LAXATIVE) 2.1 G RE SUPP
1.0000 | Freq: Once | RECTAL | Status: AC
  Administered 2019-06-11: 1 via RECTAL
  Filled 2019-06-11: qty 1

## 2019-06-11 MED ORDER — SIMETHICONE 80 MG PO CHEW
80.0000 mg | CHEWABLE_TABLET | Freq: Four times a day (QID) | ORAL | Status: DC | PRN
  Administered 2019-06-11: 80 mg via ORAL
  Filled 2019-06-11: qty 1

## 2019-06-11 MED ORDER — SODIUM CHLORIDE 0.9 % IV BOLUS
1000.0000 mL | Freq: Once | INTRAVENOUS | Status: AC
  Administered 2019-06-11: 1000 mL via INTRAVENOUS

## 2019-06-11 MED ORDER — TIZANIDINE HCL 4 MG PO TABS
4.0000 mg | ORAL_TABLET | Freq: Four times a day (QID) | ORAL | Status: DC | PRN
  Administered 2019-06-11: 4 mg via ORAL
  Filled 2019-06-11 (×2): qty 1

## 2019-06-11 MED ORDER — POLYETHYLENE GLYCOL 3350 17 G PO PACK
17.0000 g | PACK | Freq: Every day | ORAL | Status: DC
  Administered 2019-06-11 – 2019-06-12 (×2): 17 g via ORAL
  Filled 2019-06-11 (×2): qty 1

## 2019-06-11 MED ORDER — TIZANIDINE HCL 2 MG PO TABS
2.0000 mg | ORAL_TABLET | Freq: Four times a day (QID) | ORAL | Status: DC | PRN
  Filled 2019-06-11: qty 1

## 2019-06-11 NOTE — Plan of Care (Signed)
  Problem: Clinical Measurements: Goal: Will remain free from infection Outcome: Progressing  Patient afebrile at this time.  Problem: Clinical Measurements: Goal: Cardiovascular complication will be avoided Outcome: Progressing  Patient denies chest pain, SOB or palpitations at this time.

## 2019-06-11 NOTE — Progress Notes (Signed)
   NAMEJuliyah Murphy, MRN:  [id number], DOB:  Jan 30, 1956, LOS: 3 ADMISSION DATE:  06/08/2019  Subjective  Feeling better today. No complaints. One incident of lower blood pressure this afternoon however Jane Murphy is asymptomatic. Objective   Blood pressure (!) 82/56, pulse 89, temperature (!) 97.4 F (36.3 C), temperature source Oral, resp. rate 18, height 5\' 7"  (1.702 m), weight 91.5 kg, SpO2 94 %.     Intake/Output Summary (Last 24 hours) at 06/11/2019 1225 Last data filed at 06/11/2019 0343 Gross per 24 hour  Intake 1485 ml  Output 1225 ml  Net 260 ml   Filed Weights   06/08/19 1752 06/10/19 0433 06/11/19 0347  Weight: 92.1 kg 93.7 kg 91.5 kg    Examination: GENERAL: in no acute distress CARDIAC: tachycardic. Regular rhythm. No peripheral edema.  PULMONARY:  Lung sounds clear to auscultation. ABDOMEN: soft. +epigastric pain.  Nondistended.  SKIN: no rash or lesions on limited exam   Labs    CBC Latest Ref Rng & Units 06/11/2019 06/10/2019 06/09/2019  WBC 4.0 - 10.5 K/uL 10.7(H) 12.8(H) 18.1(H)  Hemoglobin 12.0 - 15.0 g/dL 08/09/2019 02.5 85.2  Hematocrit 36.0 - 46.0 % 40.0 38.4 39.1  Platelets 150 - 400 K/uL 266 261 341   BMP Latest Ref Rng & Units 06/11/2019 06/10/2019 06/09/2019  Glucose 70 - 99 mg/dL 08/09/2019) 242(P) 536(R)  BUN 8 - 23 mg/dL 5(L) 443(X) <5(Q)  Creatinine 0.44 - 1.00 mg/dL <0(G 8.67 6.19  BUN/Creat Ratio 12 - 28 - - -  Sodium 135 - 145 mmol/L 142 141 145  Potassium 3.5 - 5.1 mmol/L 3.7 2.9(L) 3.3(L)  Chloride 98 - 111 mmol/L 103 102 106  CO2 22 - 32 mmol/L 26 30 28   Calcium 8.9 - 10.3 mg/dL 9.2 9.0 9.4    Summary  64 yo female presenting with irretractable n/v and abdominal pain that started 1h following food injestion suspected to be attributable to food poisioning from bacterial toxin.  Assessment & Plan:  Active Problems:   Intractable nausea and vomiting   Epigastric pain   Tachycardia   Abdominal pain with nausea and vomiting Likely due to viral  gastroenteritis. Suspect the abdominal pain is muscular from repeated emesis and should improve on its own over time.  Overall still seems to be improving. Required IV dilaudid overnight. Tolerated bacon and eggs this morning though without vomiting. Trialed a muscle relaxer this afternoon however patient became acutely hypotensive afterwards and suspect this is attributable to muscle relaxer. Plan: will try to back off on narcotics and see how she does over today. If she is able to be without narcotics throughout today, suspect she will be able to d/c this evening or tomorrow morning. Will hold off on further dosing of muscle relaxers   Best practice:  CODE STATUS: Full Diet: full liquid DVT for prophylaxis: lovenox Dispo: home in 0-1d  , MD INTERNAL MEDICINE RESIDENT PGY-1 PAGER #: [phone] 06/11/19  12:25 PM

## 2019-06-11 NOTE — Progress Notes (Signed)
Patient up to bedside commode, had urine but no bowel movement. Pt appears to have just passed glycerin suppository.

## 2019-06-12 ENCOUNTER — Encounter (HOSPITAL_COMMUNITY): Payer: Self-pay | Admitting: Internal Medicine

## 2019-06-12 DIAGNOSIS — K529 Noninfective gastroenteritis and colitis, unspecified: Secondary | ICD-10-CM

## 2019-06-12 LAB — GLUCOSE, CAPILLARY
Glucose-Capillary: 155 mg/dL — ABNORMAL HIGH (ref 70–99)
Glucose-Capillary: 105 mg/dL — ABNORMAL HIGH (ref 70–99)

## 2019-06-12 LAB — TROPONIN I (HIGH SENSITIVITY): Troponin I (High Sensitivity): 7 ng/L (ref <18)

## 2019-06-12 LAB — LIPASE, BLOOD: Lipase: 27 U/L (ref 11–51)

## 2019-06-12 MED ORDER — AMLODIPINE BESYLATE 5 MG PO TABS: 5.0000 mg | ORAL_TABLET | Freq: Every day | ORAL | Status: DC

## 2019-06-12 MED ORDER — TIZANIDINE HCL 2 MG PO TABS: 2.0000 mg | ORAL_TABLET | Freq: Four times a day (QID) | ORAL | 0 refills | Status: DC | PRN

## 2019-06-12 MED ORDER — OXYCODONE-ACETAMINOPHEN 5-325 MG PO TABS: 1.0000 | ORAL_TABLET | Freq: Once | ORAL | Status: DC

## 2019-06-12 MED ORDER — AMLODIPINE BESYLATE 10 MG PO TABS: 10.0000 mg | ORAL_TABLET | Freq: Every day | ORAL | Status: DC

## 2019-06-12 MED ORDER — PANTOPRAZOLE SODIUM 20 MG PO TBEC: 20.0000 mg | DELAYED_RELEASE_TABLET | Freq: Every day | ORAL | 0 refills | Status: DC

## 2019-06-12 NOTE — Discharge Instructions (Signed)
It was such a pleasure taking care of you during your hospitalization.  Most likely, your symptoms were from a viral gastroenteritis. Your abdominal discomfort that you have now is most likely due to the vomiting you had that made your muscles sore. I would expect this to improve over the following weeks. If you do continue to have abdominal pain, I would suggest that you be seen by GI for further evaluation. I would also like to have you see [place] in clinic sometime in the next week so we can see how you are doing and if any changes need to be made.  Thank you for putting your trust in the Internal Medicine Team and we wish you all the best!

## 2019-06-12 NOTE — Progress Notes (Signed)
   NAMEKeita Demarco, MRN:  [id number], DOB:  02-Jun-1955, LOS: 4 ADMISSION DATE:  06/08/2019  Subjective  No overnight events. VSS Endorses epigastric abdominal pain that radiates to her back. Worse with getting up. Not associated with eating.  Did have a large bowel movement this morning! Objective   Blood pressure (!) 139/97, pulse (!) 110, temperature 98 F (36.7 C), temperature source Oral, resp. rate 18, height 5\' 7"  (1.702 m), weight 91.6 kg, SpO2 97 %.     Intake/Output Summary (Last 24 hours) at 06/12/2019 08/12/2019 Last data filed at 06/12/2019 0555 Gross per 24 hour  Intake 1000 ml  Output 2075 ml  Net -1075 ml   Filed Weights   06/10/19 0433 06/11/19 0347 06/12/19 0557  Weight: 93.7 kg 91.5 kg 91.6 kg    Examination: GENERAL: in no acute distress CARDIAC: RRR PULMONARY:  Lung sounds clear to auscultation. ABDOMEN: soft. +epigastric pain that is TTP.  Nondistended.  SKIN: no rash or lesions on limited exam   Labs    CBC Latest Ref Rng & Units 06/11/2019 06/10/2019 06/09/2019  WBC 4.0 - 10.5 K/uL 10.7(H) 12.8(H) 18.1(H)  Hemoglobin 12.0 - 15.0 g/dL 08/09/2019 72.5 36.6  Hematocrit 36.0 - 46.0 % 40.0 38.4 39.1  Platelets 150 - 400 K/uL 266 261 341   BMP Latest Ref Rng & Units 06/11/2019 06/10/2019 06/09/2019  Glucose 70 - 99 mg/dL 08/09/2019) 347(Q) 259(D)  BUN 8 - 23 mg/dL 5(L) 638(V) <5(I)  Creatinine 0.44 - 1.00 mg/dL <4(P 3.29 5.18  BUN/Creat Ratio 12 - 28 - - -  Sodium 135 - 145 mmol/L 142 141 145  Potassium 3.5 - 5.1 mmol/L 3.7 2.9(L) 3.3(L)  Chloride 98 - 111 mmol/L 103 102 106  CO2 22 - 32 mmol/L 26 30 28   Calcium 8.9 - 10.3 mg/dL 9.2 9.0 9.4    Summary  64 yo female presenting with irretractable n/v and abdominal pain that started 1h following food injestion suspected to be attributable to food poisioning from bacterial toxin.  Assessment & Plan:  Active Problems:   Intractable nausea and vomiting   Epigastric pain   Tachycardia   Abdominal pain with nausea and  vomiting Likely due to viral gastroenteritis. Suspect the abdominal pain is muscular from repeated emesis and should improve on its own over time.   Did obtain a lipase, troponin and EKG for evaluation of the epigastric pain. Troponin and lipase wnl. EKG essentially unchanged from prior.  We discussed need for GI follow up if the pain continues so she can have further evaluation, potentially with EGD.  Plan: medically stable for discharge.   Best practice:  CODE STATUS: Full Diet: full DVT for prophylaxis: lovenox Dispo: discharge  , MD INTERNAL MEDICINE RESIDENT PGY-1 PAGER #: [phone] 06/12/19  7:12 AM

## 2019-06-14 NOTE — Discharge Summary (Signed)
Name: Jane Murphy MRN: [id number] DOB: [birth year] 64 y.o. PCP: System, Pcp Not In  Date of Admission: 06/08/2019  5:50 AM Date of Discharge: 06/12/2019 Attending Physician: Dr. Heber [place]  Discharge Diagnosis: 1. Abdominal pain due to Gastroenteritis--suspected viral vs endotoxin  Discharge Medications: Allergies as of 06/12/2019      Reactions   Aspirin Anaphylaxis   Nsaids Anaphylaxis   Other Anaphylaxis   MSG   Food Hives, Other (See Comments)   Citrus Fruit also causes mouth ulcers.      Medication List    STOP taking these medications   diazepam 5 MG tablet Commonly known as: VALIUM   HYDROcodone-acetaminophen 5-325 MG tablet Commonly known as: NORCO/VICODIN   predniSONE 10 MG tablet Commonly known as: DELTASONE   traMADol 50 MG tablet Commonly known as: ULTRAM     TAKE these medications   acetaminophen 500 MG tablet Commonly known as: TYLENOL Take 1 tablet (500 mg total) by mouth 2 (two) times daily. What changed:   how much to take  when to take this  reasons to take this   atorvastatin 10 MG tablet Commonly known as: LIPITOR Take 10 mg by mouth daily. What changed: Another medication with the same name was removed. Continue taking this medication, and follow the directions you see here.   fluticasone 50 MCG/ACT nasal spray Commonly known as: FLONASE Place 1 spray into both nostrils daily as needed for allergies or rhinitis.   losartan 100 MG tablet Commonly known as: COZAAR Take 100 mg by mouth daily.   metFORMIN 500 MG tablet Commonly known as: GLUCOPHAGE Take 1 tablet (500 mg total) by mouth daily.   pantoprazole 20 MG tablet Commonly known as: PROTONIX Take 1 tablet (20 mg total) by mouth daily.   tiZANidine 2 MG tablet Commonly known as: ZANAFLEX Take 1 tablet (2 mg total) by mouth every 6 (six) hours as needed for muscle spasms. What changed:   medication strength  how much to take       Disposition and follow-up:    Jane Murphy was discharged from [facility] in Stable condition.  At the hospital follow up visit please address:  1.  Abdominal pain secondary to gastroenteritis. If abdominal pain is not improving, would consider referral to GI for possible EGD.   Labs / imaging needed at time of follow-up: none   Follow-up Appointments: Follow-up [place] Follow up in 1 week(s).   Contact information: [address] [facility].   Specialty: Gastroenterology Contact information: [place] [id number] [phone]          Hospital Course: Jane Murphy is a 64 yo female with a PMH of hypertension and type II DM who presented to [facility] ED on 06/08/19 for 1d history of irretractable n/v and abdominal pain which began about one hour following ingestion of seafood which she received from meals on wheels. A CT scan obtained in the ER was unremarkable for acute findings. The nausea and vomiting quickly resided following admission however she continued to experience epigastric abdominal pain and she was requiring frequent IV dilaudid dosing. Over the course of her hospitalization, she was able to be transitioned off of this though she was still experiencing some abdominal pain which she reported to remain in the epigastric region with radiation to her back. It was not worsened  with food however pain would increase with exertion. Lipase and cardiac workup were unremarkable. At time of discharge, she was tolerating po diet well.  Given the time frame, I suspect that the inciting event was a bacterial endotoxin or viral. I suspect the abdominal pain was muscular and possibly esophagitis related to her repeated vomiting and should improve over time.  Hospital stay was complicated by persistent tachycardia that was exacerbated  with movement (into 150s) which slowly improved. I'm sure what was causing this however would consider pain and dehydration to be contributory.   Hospital stay was also complicated and extended by constipation requiring a significant about of stool softeners and prokinetic agents. On day of discharge, she had a BM.  Discharge Vitals:   BP (!) 175/83 (BP Location: Left Arm)   Pulse (!) 110   Temp 98 F (36.7 C) (Oral)   Resp 18   Ht 5\' 7"  (1.702 m)   Wt 91.6 kg   SpO2 98%   BMI 31.64 kg/m   Pertinent Labs, Studies, and Procedures:   CT a/p: no acute findings  CBC Latest Ref Rng & Units 06/11/2019 06/10/2019 06/09/2019  WBC 4.0 - 10.5 K/uL 10.7(H) 12.8(H) 18.1(H)  Hemoglobin 12.0 - 15.0 g/dL 08/09/2019 29.9 37.1  Hematocrit 36.0 - 46.0 % 40.0 38.4 39.1  Platelets 150 - 400 K/uL 266 261 341   BMP Latest Ref Rng & Units 06/11/2019 06/10/2019 06/09/2019  Glucose 70 - 99 mg/dL 08/09/2019) 789(F) 810(F)  BUN 8 - 23 mg/dL 5(L) 751(W) <2(H)  Creatinine 0.44 - 1.00 mg/dL <8(N 2.77 8.24  BUN/Creat Ratio 12 - 28 - - -  Sodium 135 - 145 mmol/L 142 141 145  Potassium 3.5 - 5.1 mmol/L 3.7 2.9(L) 3.3(L)  Chloride 98 - 111 mmol/L 103 102 106  CO2 22 - 32 mmol/L 26 30 28   Calcium 8.9 - 10.3 mg/dL 9.2 9.0 9.4     Discharge Instructions: Discharge Instructions    Discharge instructions   Complete by: As directed    Follow up with GI if symptoms do not improve.      Signed2.35, MD 06/14/2019, 7:31 AM   Pager: [phone]

## 2020-01-24 ENCOUNTER — Emergency Department (HOSPITAL_COMMUNITY)
Admission: EM | Admit: 2020-01-24 | Discharge: 2020-01-25 | Disposition: A | Discharge status: Home / Self Care | Payer: No Typology Code available for payment source | Attending: Emergency Medicine | Admitting: Emergency Medicine

## 2020-01-24 ENCOUNTER — Emergency Department (HOSPITAL_COMMUNITY): Payer: No Typology Code available for payment source

## 2020-01-24 ENCOUNTER — Other Ambulatory Visit: Payer: Self-pay

## 2020-01-24 DIAGNOSIS — I10 Essential (primary) hypertension: Secondary | ICD-10-CM | POA: Insufficient documentation

## 2020-01-24 DIAGNOSIS — Z87891 Personal history of nicotine dependence: Secondary | ICD-10-CM | POA: Insufficient documentation

## 2020-01-24 DIAGNOSIS — Z79899 Other long term (current) drug therapy: Secondary | ICD-10-CM | POA: Insufficient documentation

## 2020-01-24 DIAGNOSIS — R319 Hematuria, unspecified: Secondary | ICD-10-CM | POA: Diagnosis not present

## 2020-01-24 DIAGNOSIS — R52 Pain, unspecified: Secondary | ICD-10-CM | POA: Diagnosis not present

## 2020-01-24 DIAGNOSIS — N200 Calculus of kidney: Secondary | ICD-10-CM

## 2020-01-24 LAB — URINALYSIS, ROUTINE W REFLEX MICROSCOPIC
Bacteria, UA: NONE SEEN
Bilirubin Urine: NEGATIVE
Glucose, UA: NEGATIVE mg/dL
Ketones, ur: NEGATIVE mg/dL
Leukocytes,Ua: NEGATIVE
Nitrite: NEGATIVE
Protein, ur: 30 mg/dL — AB
RBC / HPF: >50 RBC/hpf — ABNORMAL HIGH (ref 0–5)
Specific Gravity, Urine: 1.026 (ref 1.005–1.030)
pH: 5 (ref 5.0–8.0)

## 2020-01-24 MED ORDER — MORPHINE SULFATE (PF) 4 MG/ML IV SOLN
4.0000 mg | Freq: Once | INTRAVENOUS | Status: AC
  Administered 2020-01-24: 4 mg via INTRAVENOUS
  Filled 2020-01-24: qty 1

## 2020-01-24 MED ORDER — ONDANSETRON HCL 4 MG/2ML IJ SOLN
4.0000 mg | Freq: Once | INTRAMUSCULAR | Status: AC
  Administered 2020-01-24: 4 mg via INTRAVENOUS
  Filled 2020-01-24: qty 2

## 2020-01-24 NOTE — ED Triage Notes (Signed)
Pt says she passed a kidney stone this morning and is having bladder spasms and pain 6/10.

## 2020-01-24 NOTE — Discharge Instructions (Addendum)
Follow-up with your urologist.  Return to ER if you develop fever, worsening pain or other new concerning symptom.

## 2020-01-24 NOTE — ED Provider Notes (Signed)
[facility]-EMERGENCY DEPT Provider Note   CSN: [id number] Arrival date & time: 01/24/20  1530     History Chief Complaint  Patient presents with  . Hematuria    Jane Murphy is a 64 y.o. female.  Presents to ER with concern for kidney stone.  She reports that she started having symptoms yesterday, worse today, worse on right side, pain up to 6-10 in severity sharp, stabbing, comes and goes.  Reports that she thinks she passed a kidney stone approximately 4 mm this morning.  Still having some ongoing pain and wanted to be evaluated.  Has noted some blood in her since passing the stone.  HPI     Past Medical History:  Diagnosis Date  . Arthritis   . Hypertension   . Kidney stone   . Osteoarthritis   . PTSD (post-traumatic stress disorder)   . Rectocele   . Shingles   . Trigger finger of right thumb     Patient Active Problem List   Diagnosis Date Noted  . Epigastric pain   . Tachycardia   . Intractable nausea and vomiting 06/08/2019  . Trigger finger of right thumb 04/22/2018  . Allergies 04/22/2018  . Chronic osteoarthritis 04/22/2018  . Class 2 severe obesity due to excess calories with serious comorbidity and body mass index (BMI) of 37.0 to 37.9 in adult (HCC) 04/22/2018  . Hyperlipidemia 04/22/2018    Past Surgical History:  Procedure Laterality Date  . APPENDECTOMY    . C section    . CHOLECYSTECTOMY    . HERNIA REPAIR    . KNEE SURGERY    . OOPHORECTOMY    . SPINAL FUSION    . TONSILLECTOMY       OB History   No obstetric history on file.     No family history on file.  Social History   Tobacco Use  . Smoking status: Former Games developer  . Smokeless tobacco: Never Used  Substance Use Topics  . Alcohol use: Yes    Alcohol/week: 4.0 standard drinks    Types: 4 Cans of beer per week    Comment: Occasional   . Drug use: No    Home Medications Prior to Admission medications   Medication Sig Start Date End Date Taking?  Authorizing Provider  acetaminophen (TYLENOL) 500 MG tablet Take 1 tablet (500 mg total) by mouth 2 (two) times daily. Patient taking differently: Take 1,000 mg by mouth every 6 (six) hours as needed for mild pain.  04/22/18   Kallie Locks, FNP  atorvastatin (LIPITOR) 10 MG tablet Take 10 mg by mouth daily.    [provider]  fluticasone (FLONASE) 50 MCG/ACT nasal spray Place 1 spray into both nostrils daily as needed for allergies or rhinitis. 04/22/18   Kallie Locks, FNP  losartan (COZAAR) 100 MG tablet Take 100 mg by mouth daily.    [provider]  metFORMIN (GLUCOPHAGE) 500 MG tablet Take 1 tablet (500 mg total) by mouth daily. 04/22/18   Kallie Locks, FNP  pantoprazole (PROTONIX) 20 MG tablet Take 1 tablet (20 mg total) by mouth daily. 06/13/19   Elige Radon, MD  tiZANidine (ZANAFLEX) 2 MG tablet Take 1 tablet (2 mg total) by mouth every 6 (six) hours as needed for muscle spasms. 06/12/19   Elige Radon, MD    Allergies    Aspirin, Nsaids, Other, and Food  Review of Systems   Review of Systems  Constitutional: Negative for chills and fever.  HENT: Negative for ear pain and sore throat.   Eyes: Negative for pain and visual disturbance.  Respiratory: Negative for cough and shortness of breath.   Cardiovascular: Negative for chest pain and palpitations.  Gastrointestinal: Positive for abdominal pain. Negative for vomiting.  Genitourinary: Positive for flank pain. Negative for dysuria and hematuria.  Musculoskeletal: Negative for arthralgias and back pain.  Skin: Negative for color change and rash.  Neurological: Negative for seizures and syncope.  All other systems reviewed and are negative.   Physical Exam Updated Vital Signs BP (!) 174/100 (BP Location: Left Arm)   Pulse 81   Temp 97.9 F (36.6 C) (Oral)   Resp 18   SpO2 98%   Physical Exam Vitals and nursing note reviewed.  Constitutional:      General: She is not in acute distress.     Appearance: She is well-developed.  HENT:     Head: Normocephalic and atraumatic.  Eyes:     Conjunctiva/sclera: Conjunctivae normal.  Cardiovascular:     Rate and Rhythm: Normal rate and regular rhythm.     Heart sounds: No murmur heard.   Pulmonary:     Effort: Pulmonary effort is normal. No respiratory distress.     Breath sounds: Normal breath sounds.  Abdominal:     Palpations: Abdomen is soft.     Tenderness: There is no abdominal tenderness.  Musculoskeletal:        General: No deformity or signs of injury.     Cervical back: Neck supple.  Skin:    General: Skin is warm and dry.     Capillary Refill: Capillary refill takes less than 2 seconds.  Neurological:     General: No focal deficit present.     Mental Status: She is alert and oriented to person, place, and time.  Psychiatric:        Mood and Affect: Mood normal.        Behavior: Behavior normal.     ED Results / Procedures / Treatments   Labs (all labs ordered are listed, but only abnormal results are displayed) Labs Reviewed  URINALYSIS, ROUTINE W REFLEX MICROSCOPIC - Abnormal; Notable for the following components:      Result Value   Hgb urine dipstick LARGE (*)    Protein, ur 30 (*)    RBC / HPF >50 (*)    All other components within normal limits  CBC WITH DIFFERENTIAL/PLATELET  COMPREHENSIVE METABOLIC PANEL    EKG None  Radiology CT Renal Stone Study  Result Date: 01/24/2020 CLINICAL DATA:  Flank pain, history of recently passed stone. EXAM: CT ABDOMEN AND PELVIS WITHOUT CONTRAST TECHNIQUE: Multidetector CT imaging of the abdomen and pelvis was performed following the standard protocol without IV contrast. COMPARISON:  06/08/2019 FINDINGS: Lower chest: No acute abnormality. Hepatobiliary: No focal liver abnormality is seen. No gallstones, gallbladder wall thickening, or biliary dilatation. Pancreas: Unremarkable. No pancreatic ductal dilatation or surrounding inflammatory changes. Spleen: Normal  in size without focal abnormality. Adrenals/Urinary Tract: Adrenal glands are within normal limits bilaterally. Kidneys are well visualized bilaterally. No definitive renal calculi are seen. No obstructive changes are noted. The ureters are within normal limits. The bladder is decompressed. A small amount of air is noted within the bladder which may be related to recent instrumentation. Stomach/Bowel: Scattered diverticular change of the colon is noted. The appendix has been surgically removed. Small bowel and stomach appear within normal limits. Vascular/Lymphatic: Aortic atherosclerosis. No enlarged abdominal or pelvic lymph nodes. Reproductive: Uterus  and bilateral adnexa are unremarkable. Other: No abdominal wall hernia or abnormality. No abdominopelvic ascites. Musculoskeletal: Degenerative changes of lumbar spine are noted. No acute abnormality seen. IMPRESSION: No renal calculi or changes of recently passed stone are seen on this exam. Diverticulosis without diverticulitis. No acute abnormality noted. Electronically Signed   By: Alcide Clever M.D.   On: 01/24/2020 21:22    Procedures Procedures (including critical care time)  Medications Ordered in ED Medications  morphine 4 MG/ML injection 4 mg (4 mg Intravenous Given 01/24/20 2331)  ondansetron (ZOFRAN) injection 4 mg (4 mg Intravenous Given 01/24/20 2332)    ED Course  I have reviewed the triage vital signs and the nursing notes.  Pertinent labs & imaging results that were available during my care of the patient were reviewed by me and considered in my medical decision making (see chart for details).    MDM Rules/Calculators/A&P                         64 year old lady presenting to ER with concern for flank pain, hematuria.  She reports passing stone at home prior to arrival.  CT negative for any additional stones.  Suspect symptoms related to her original kidney stone.  Pain is well controlled, she is afebrile, noted some blood in the  urine but no infection.  Believe she is likely appropriate for discharge and outpatient management.  Her basic labs were in process at time of signout.  Dr. Read Drivers will follow up on these, if normal anticipate discharge.  Final Clinical Impression(s) / ED Diagnoses Final diagnoses:  Kidney stone    Rx / DC Orders ED Discharge Orders    None       Milagros Loll, MD 01/25/20 0000

## 2020-01-25 LAB — COMPREHENSIVE METABOLIC PANEL
ALT: 13 U/L (ref 0–44)
AST: 24 U/L (ref 15–41)
Albumin: 4.7 g/dL (ref 3.5–5.0)
Alkaline Phosphatase: 48 U/L (ref 38–126)
Anion gap: 11 (ref 5–15)
BUN: 17 mg/dL (ref 8–23)
CO2: 23 mmol/L (ref 22–32)
Calcium: 9.8 mg/dL (ref 8.9–10.3)
Chloride: 106 mmol/L (ref 98–111)
Creatinine, Ser: 0.76 mg/dL (ref 0.44–1.00)
GFR, Estimated: >60 mL/min (ref >60)
Glucose, Bld: 117 mg/dL — ABNORMAL HIGH (ref 70–99)
Potassium: 4 mmol/L (ref 3.5–5.1)
Sodium: 140 mmol/L (ref 135–145)
Total Bilirubin: 0.8 mg/dL (ref 0.3–1.2)
Total Protein: 7.7 g/dL (ref 6.5–8.1)

## 2020-01-25 LAB — CBC WITH DIFFERENTIAL/PLATELET
Abs Immature Granulocytes: 0.03 10*3/uL (ref 0.00–0.07)
Basophils Absolute: 0.1 10*3/uL (ref 0.0–0.1)
Basophils Relative: 1 %
Eosinophils Absolute: 0.2 10*3/uL (ref 0.0–0.5)
Eosinophils Relative: 1 %
HCT: 42.1 % (ref 36.0–46.0)
Hemoglobin: 13.8 g/dL (ref 12.0–15.0)
Immature Granulocytes: 0 %
Lymphocytes Relative: 35 %
Lymphs Abs: 4.6 10*3/uL — ABNORMAL HIGH (ref 0.7–4.0)
MCH: 30.3 pg (ref 26.0–34.0)
MCHC: 32.8 g/dL (ref 30.0–36.0)
MCV: 92.5 fL (ref 80.0–100.0)
Monocytes Absolute: 0.8 10*3/uL (ref 0.1–1.0)
Monocytes Relative: 6 %
Neutro Abs: 7.4 10*3/uL (ref 1.7–7.7)
Neutrophils Relative %: 57 %
Platelets: 283 10*3/uL (ref 150–400)
RBC: 4.55 MIL/uL (ref 3.87–5.11)
RDW: 12.5 % (ref 11.5–15.5)
WBC: 13.3 10*3/uL — ABNORMAL HIGH (ref 4.0–10.5)
nRBC: 0 % (ref 0.0–0.2)

## 2020-01-25 MED ORDER — MORPHINE SULFATE (PF) 4 MG/ML IV SOLN
INTRAVENOUS | Status: AC
  Administered 2020-01-25: 4 mg via INTRAVENOUS
  Filled 2020-01-25: qty 1

## 2020-01-25 MED ORDER — MORPHINE SULFATE (PF) 4 MG/ML IV SOLN: 4.0000 mg | Freq: Once | INTRAVENOUS | Status: AC

## 2020-01-25 NOTE — ED Provider Notes (Signed)
Nursing notes and vitals signs, including pulse oximetry, reviewed.  Summary of this visit's results, reviewed by myself:  EKG:  EKG Interpretation  Date/Time:    Ventricular Rate:    PR Interval:    QRS Duration:   QT Interval:    QTC Calculation:   R Axis:     Text Interpretation:         Labs:  Results for orders placed or performed during the hospital encounter of 01/24/20 (from the past 24 hour(s))  Urinalysis, Routine w reflex microscopic     Status: Abnormal   Collection Time: 01/24/20  3:47 PM  Result Value Ref Range   Color, Urine YELLOW YELLOW   APPearance CLEAR CLEAR   Specific Gravity, Urine 1.026 1.005 - 1.030   pH 5.0 5.0 - 8.0   Glucose, UA NEGATIVE NEGATIVE mg/dL   Hgb urine dipstick LARGE (A) NEGATIVE   Bilirubin Urine NEGATIVE NEGATIVE   Ketones, ur NEGATIVE NEGATIVE mg/dL   Protein, ur 30 (A) NEGATIVE mg/dL   Nitrite NEGATIVE NEGATIVE   Leukocytes,Ua NEGATIVE NEGATIVE   RBC / HPF >50 (H) 0 - 5 RBC/hpf   WBC, UA 0-5 0 - 5 WBC/hpf   Bacteria, UA NONE SEEN NONE SEEN   Squamous Epithelial / LPF 0-5 0 - 5   Mucus PRESENT   Comprehensive metabolic panel     Status: Abnormal   Collection Time: 01/24/20 11:32 PM  Result Value Ref Range   Sodium 140 135 - 145 mmol/L   Potassium 4.0 3.5 - 5.1 mmol/L   Chloride 106 98 - 111 mmol/L   CO2 23 22 - 32 mmol/L   Glucose, Bld 117 (H) 70 - 99 mg/dL   BUN 17 8 - 23 mg/dL   Creatinine, Ser 0.08 0.44 - 1.00 mg/dL   Calcium 9.8 8.9 - 67.6 mg/dL   Total Protein 7.7 6.5 - 8.1 g/dL   Albumin 4.7 3.5 - 5.0 g/dL   AST 24 15 - 41 U/L   ALT 13 0 - 44 U/L   Alkaline Phosphatase 48 38 - 126 U/L   Total Bilirubin 0.8 0.3 - 1.2 mg/dL   GFR, Estimated >19 >50 mL/min   Anion gap 11 5 - 15  CBC with Differential/Platelet     Status: Abnormal   Collection Time: 01/25/20  1:00 AM  Result Value Ref Range   WBC 13.3 (H) 4.0 - 10.5 K/uL   RBC 4.55 3.87 - 5.11 MIL/uL   Hemoglobin 13.8 12.0 - 15.0 g/dL   HCT 93.2 36 - 46 %    MCV 92.5 80.0 - 100.0 fL   MCH 30.3 26.0 - 34.0 pg   MCHC 32.8 30.0 - 36.0 g/dL   RDW 67.1 24.5 - 80.9 %   Platelets 283 150 - 400 K/uL   nRBC 0.0 0.0 - 0.2 %   Neutrophils Relative % 57 %   Neutro Abs 7.4 1.7 - 7.7 K/uL   Lymphocytes Relative 35 %   Lymphs Abs 4.6 (H) 0.7 - 4.0 K/uL   Monocytes Relative 6 %   Monocytes Absolute 0.8 0.1 - 1.0 K/uL   Eosinophils Relative 1 %   Eosinophils Absolute 0.2 0.0 - 0.5 K/uL   Basophils Relative 1 %   Basophils Absolute 0.1 0.0 - 0.1 K/uL   Immature Granulocytes 0 %   Abs Immature Granulocytes 0.03 0.00 - 0.07 K/uL    Imaging Studies: CT Renal Stone Study  Result Date: 01/24/2020 CLINICAL DATA:  Flank pain, history of  recently passed stone. EXAM: CT ABDOMEN AND PELVIS WITHOUT CONTRAST TECHNIQUE: Multidetector CT imaging of the abdomen and pelvis was performed following the standard protocol without IV contrast. COMPARISON:  06/08/2019 FINDINGS: Lower chest: No acute abnormality. Hepatobiliary: No focal liver abnormality is seen. No gallstones, gallbladder wall thickening, or biliary dilatation. Pancreas: Unremarkable. No pancreatic ductal dilatation or surrounding inflammatory changes. Spleen: Normal in size without focal abnormality. Adrenals/Urinary Tract: Adrenal glands are within normal limits bilaterally. Kidneys are well visualized bilaterally. No definitive renal calculi are seen. No obstructive changes are noted. The ureters are within normal limits. The bladder is decompressed. A small amount of air is noted within the bladder which may be related to recent instrumentation. Stomach/Bowel: Scattered diverticular change of the colon is noted. The appendix has been surgically removed. Small bowel and stomach appear within normal limits. Vascular/Lymphatic: Aortic atherosclerosis. No enlarged abdominal or pelvic lymph nodes. Reproductive: Uterus and bilateral adnexa are unremarkable. Other: No abdominal wall hernia or abnormality. No  abdominopelvic ascites. Musculoskeletal: Degenerative changes of lumbar spine are noted. No acute abnormality seen. IMPRESSION: No renal calculi or changes of recently passed stone are seen on this exam. Diverticulosis without diverticulitis. No acute abnormality noted. Electronically Signed   By: Alcide Clever M.D.   On: 01/24/2020 21:22   Presentation consistent with passed ureteral stone.    Lane Kjos, Jonny Ruiz, MD 01/25/20 [phone]

## 2020-01-26 ENCOUNTER — Telehealth: Payer: Self-pay

## 2020-01-26 NOTE — Telephone Encounter (Signed)
Transition Care Management Follow-up Telephone Call  Date of discharge and from where: 01/24/2020 from [facility]  How have you been since you were released from the hospital? Pt is still in pain but it is better and understands what she needs to do.   Any questions or concerns? No  Items Reviewed:  Did the pt receive and understand the discharge instructions provided? Yes   Medications obtained and verified? Yes   Other? No   Any new allergies since your discharge? No   Dietary orders reviewed? Yes  Do you have support at home? Yes   Functional Questionnaire: (I = Independent and D = Dependent)  ADLs: I Bathing/Dressing- I Meal Prep- I Eating- I Maintaining continence- I Transferring/Ambulation- I Managing Meds- I   Follow up appointments reviewed:   PCP Hospital f/u appt confirmed? No    Are transportation arrangements needed? No   If their condition worsens, is the pt aware to call PCP or go to the Emergency Dept.? Yes  Was the patient provided with contact information for the PCP's office or ED? Yes  Was to pt encouraged to call back with questions or concerns? Yes

## 2020-03-04 DIAGNOSIS — Z1231 Encounter for screening mammogram for malignant neoplasm of breast: Secondary | ICD-10-CM | POA: Diagnosis not present

## 2020-08-27 ENCOUNTER — Encounter (HOSPITAL_COMMUNITY): Payer: Self-pay

## 2020-08-27 ENCOUNTER — Emergency Department (HOSPITAL_COMMUNITY): Payer: No Typology Code available for payment source

## 2020-08-27 ENCOUNTER — Emergency Department (HOSPITAL_COMMUNITY)
Admission: EM | Admit: 2020-08-27 | Discharge: 2020-08-27 | Disposition: A | Discharge status: Home / Self Care | Payer: No Typology Code available for payment source | Attending: Emergency Medicine | Admitting: Emergency Medicine

## 2020-08-27 DIAGNOSIS — I10 Essential (primary) hypertension: Secondary | ICD-10-CM | POA: Insufficient documentation

## 2020-08-27 DIAGNOSIS — N12 Tubulo-interstitial nephritis, not specified as acute or chronic: Secondary | ICD-10-CM | POA: Diagnosis not present

## 2020-08-27 DIAGNOSIS — Z87891 Personal history of nicotine dependence: Secondary | ICD-10-CM | POA: Insufficient documentation

## 2020-08-27 DIAGNOSIS — Z79899 Other long term (current) drug therapy: Secondary | ICD-10-CM | POA: Insufficient documentation

## 2020-08-27 DIAGNOSIS — R109 Unspecified abdominal pain: Secondary | ICD-10-CM | POA: Diagnosis present

## 2020-08-27 LAB — BASIC METABOLIC PANEL
Anion gap: 9 (ref 5–15)
BUN: 12 mg/dL (ref 8–23)
CO2: 23 mmol/L (ref 22–32)
Calcium: 9.4 mg/dL (ref 8.9–10.3)
Chloride: 107 mmol/L (ref 98–111)
Creatinine, Ser: 0.75 mg/dL (ref 0.44–1.00)
GFR, Estimated: >60 mL/min (ref >60)
Glucose, Bld: 133 mg/dL — ABNORMAL HIGH (ref 70–99)
Potassium: 4.2 mmol/L (ref 3.5–5.1)
Sodium: 139 mmol/L (ref 135–145)

## 2020-08-27 LAB — URINALYSIS, ROUTINE W REFLEX MICROSCOPIC
Bacteria, UA: NONE SEEN
Bilirubin Urine: NEGATIVE
Glucose, UA: NEGATIVE mg/dL
Hgb urine dipstick: NEGATIVE
Ketones, ur: NEGATIVE mg/dL
Leukocytes,Ua: NEGATIVE
Nitrite: POSITIVE — AB
Protein, ur: NEGATIVE mg/dL
Specific Gravity, Urine: 1.024 (ref 1.005–1.030)
pH: 5 (ref 5.0–8.0)

## 2020-08-27 LAB — CBC WITH DIFFERENTIAL/PLATELET
Abs Immature Granulocytes: 0.03 10*3/uL (ref 0.00–0.07)
Basophils Absolute: 0.1 10*3/uL (ref 0.0–0.1)
Basophils Relative: 1 %
Eosinophils Absolute: 0.3 10*3/uL (ref 0.0–0.5)
Eosinophils Relative: 3 %
HCT: 42 % (ref 36.0–46.0)
Hemoglobin: 13.4 g/dL (ref 12.0–15.0)
Immature Granulocytes: 0 %
Lymphocytes Relative: 39 %
Lymphs Abs: 4 10*3/uL (ref 0.7–4.0)
MCH: 30 pg (ref 26.0–34.0)
MCHC: 31.9 g/dL (ref 30.0–36.0)
MCV: 94.2 fL (ref 80.0–100.0)
Monocytes Absolute: 0.8 10*3/uL (ref 0.1–1.0)
Monocytes Relative: 7 %
Neutro Abs: 5 10*3/uL (ref 1.7–7.7)
Neutrophils Relative %: 50 %
Platelets: 256 10*3/uL (ref 150–400)
RBC: 4.46 MIL/uL (ref 3.87–5.11)
RDW: 12.7 % (ref 11.5–15.5)
WBC: 10.2 10*3/uL (ref 4.0–10.5)
nRBC: 0 % (ref 0.0–0.2)

## 2020-08-27 LAB — LACTIC ACID, PLASMA: Lactic Acid, Venous: 1.6 mmol/L (ref 0.5–1.9)

## 2020-08-27 MED ORDER — ONDANSETRON 4 MG PO TBDP: 4.0000 mg | ORAL_TABLET | Freq: Three times a day (TID) | ORAL | 0 refills | Status: DC | PRN

## 2020-08-27 MED ORDER — ONDANSETRON HCL 4 MG/2ML IJ SOLN
4.0000 mg | Freq: Once | INTRAMUSCULAR | Status: AC
  Administered 2020-08-27: 4 mg via INTRAVENOUS
  Filled 2020-08-27: qty 2

## 2020-08-27 MED ORDER — TIZANIDINE HCL 4 MG PO TABS
4.0000 mg | ORAL_TABLET | Freq: Once | ORAL | Status: AC
  Administered 2020-08-27: 4 mg via ORAL
  Filled 2020-08-27: qty 1

## 2020-08-27 MED ORDER — SODIUM CHLORIDE 0.9 % IV BOLUS
1000.0000 mL | Freq: Once | INTRAVENOUS | Status: AC
  Administered 2020-08-27: 1000 mL via INTRAVENOUS

## 2020-08-27 MED ORDER — CEPHALEXIN 500 MG PO CAPS: 500.0000 mg | ORAL_CAPSULE | Freq: Four times a day (QID) | ORAL | 0 refills | Status: DC

## 2020-08-27 MED ORDER — SODIUM CHLORIDE 0.9 % IV SOLN
1.0000 g | Freq: Once | INTRAVENOUS | Status: AC
  Administered 2020-08-27: 1 g via INTRAVENOUS
  Filled 2020-08-27: qty 10

## 2020-08-27 MED ORDER — OXYCODONE HCL 5 MG PO TABS: 5.0000 mg | ORAL_TABLET | Freq: Four times a day (QID) | ORAL | 0 refills | Status: DC | PRN

## 2020-08-27 MED ORDER — OXYCODONE-ACETAMINOPHEN 5-325 MG PO TABS
1.0000 | ORAL_TABLET | Freq: Once | ORAL | Status: AC
  Administered 2020-08-27: 1 via ORAL
  Filled 2020-08-27: qty 1

## 2020-08-27 MED ORDER — FENTANYL CITRATE (PF) 100 MCG/2ML IJ SOLN
50.0000 ug | Freq: Once | INTRAMUSCULAR | Status: AC
Start: 2020-08-27 — End: 2020-08-27
  Administered 2020-08-27: 50 ug via INTRAVENOUS
  Filled 2020-08-27: qty 2

## 2020-08-27 NOTE — Discharge Instructions (Signed)
It is important that you stay hydrated. Take the antibiotic, keflex, as directed until gone. You can take zofran as directed for nausea. You can take the oxycodone for pain not relieved by tylenol. Be aware this medication can make you drowsy. Do not operate motor vehicles or drink alcohol while taking this medication. Follow-up with your primary care provider within 3 to 5 days. Return to the emergency department for high fever, uncontrollable vomiting, or severely worsening symptoms.

## 2020-08-27 NOTE — ED Provider Notes (Signed)
[facility] EMERGENCY [facility]PARTMENT Provider Note   CSN: [id number] Arrival date & time: 08/27/20  1533     History Chief Complaint  Patient presents with  . Cystitis  . possible bladder infection     Jane Murphy is a 65 y.o. female w PMHx DM, HTN, rectocele with small fistula, complete bowel/bladder incontinence, nephrolithiasis, recurrent UTI. Patient is [place] [facility] [place] with [facility] UA results that was initially collected on Wednesday of last week. Patient sates she has been having sx of UTI with bladder pain, nausea, malaise, chills, right flank pain. She was last treated for UTI over 1 year ago. [facility], as she is preparing to have reconstructive surgery for her incontinence/rectocele/fistula. Denies associated fever. Treated nausea with 2mg  zofran because she tried to make her last few tabs last longer, but had no relief and isn't keeping anything down.  The history is provided by the patient.       Past Medical History:  Diagnosis Date  . Arthritis   . Hypertension   . Kidney stone   . Osteoarthritis   . PTSD (post-traumatic stress disorder)   . Rectocele   . Shingles   . Trigger finger of right thumb     Patient Active Problem List   Diagnosis Date Noted  . Epigastric pain   . Tachycardia   . Intractable nausea and vomiting 06/08/2019  . Trigger finger of right thumb 04/22/2018  . Allergies 04/22/2018  . Chronic osteoarthritis 04/22/2018  . Class 2 severe obesity due to excess calories with serious comorbidity and body mass index (BMI) of 37.0 to 37.9 in adult (HCC) 04/22/2018  . Hyperlipidemia 04/22/2018    Past Surgical History:  Procedure Laterality Date  . APPENDECTOMY    . C section    . CHOLECYSTECTOMY    . HERNIA REPAIR    . KNEE SURGERY    . OOPHORECTOMY    . SPINAL FUSION    . TONSILLECTOMY       OB History   No obstetric history on file.      History reviewed. No pertinent family history.  Social History   Tobacco Use  . Smoking status: Former Games developermoker  . Smokeless tobacco: Never Used  Substance Use Topics  . Alcohol use: Yes    Alcohol/week: 4.0 standard drinks    Types: 4 Cans of beer per week    Comment: Occasional   . Drug use: No    Home Medications Prior to Admission medications   Medication Sig Start Date End Date Taking? Authorizing Provider  cephALEXin (KEFLEX) 500 MG capsule Take 1 capsule (500 mg total) by mouth 4 (four) times daily. 08/27/20  Yes Roxan Hockeyobinson, [place] N, PA-C  ondansetron (ZOFRAN ODT) 4 MG disintegrating tablet Take 1 tablet (4 mg total) by mouth every 8 (eight) hours as needed for nausea or vomiting. 08/27/20  Yes Erbie Arment, [place] N, PA-C  oxyCODONE (ROXICODONE) 5 MG immediate release tablet Take 1 tablet (5 mg total) by mouth every 6 (six) hours as needed for severe pain. 08/27/20  Yes Kerstie Agent, [place] N, PA-C  acetaminophen (TYLENOL) 500 MG tablet Take 1 tablet (500 mg total) by mouth 2 (two) times daily. Patient taking differently: Take 1,000 mg by mouth every 6 (six) hours as needed for mild pain.  04/22/18   Kallie LocksStroud, Natalie M, FNP  atorvastatin (LIPITOR) 10 MG tablet Take 10 mg by mouth daily.  [provider]  fluticasone (FLONASE) 50 MCG/ACT nasal spray Place 1 spray into both nostrils daily as needed for allergies or rhinitis. 04/22/18   Kallie Locks, FNP  losartan (COZAAR) 100 MG tablet Take 100 mg by mouth daily.    [provider]  metFORMIN (GLUCOPHAGE) 500 MG tablet Take 1 tablet (500 mg total) by mouth daily. 04/22/18   Kallie Locks, FNP  pantoprazole (PROTONIX) 20 MG tablet Take 1 tablet (20 mg total) by mouth daily. 06/13/19   Elige Radon, MD  tiZANidine (ZANAFLEX) 2 MG tablet Take 1 tablet (2 mg total) by mouth every 6 (six) hours as needed for muscle spasms. 06/12/19   Elige Radon, MD    Allergies    Aspirin, Nsaids, Other, and Food  Review of  Systems   Review of Systems  Constitutional: Positive for chills and fatigue. Negative for fever.  Gastrointestinal: Positive for abdominal pain, nausea and vomiting.  Genitourinary: Positive for flank pain.  All other systems reviewed and are negative.   Physical Exam Updated Vital Signs BP 129/75   Pulse 62   Temp 98.1 F (36.7 C) (Oral)   Resp 14   SpO2 99%   Physical Exam Vitals and nursing note reviewed.  Constitutional:      Appearance: She is well-developed. She is not ill-appearing or toxic-appearing.  HENT:     Head: Normocephalic and atraumatic.  Eyes:     Conjunctiva/sclera: Conjunctivae normal.  Cardiovascular:     Rate and Rhythm: Normal rate and regular rhythm.  Pulmonary:     Effort: Pulmonary effort is normal. No respiratory distress.     Breath sounds: Normal breath sounds.  Abdominal:     General: Bowel sounds are normal.     Palpations: Abdomen is soft.     Tenderness: There is abdominal tenderness in the suprapubic area. There is right CVA tenderness. There is no guarding or rebound.  Skin:    General: Skin is warm.  Neurological:     Mental Status: She is alert.  Psychiatric:        Behavior: Behavior normal.     ED Results / Procedures / Treatments   Labs (all labs ordered are listed, but only abnormal results are displayed) Labs Reviewed  URINALYSIS, ROUTINE W REFLEX MICROSCOPIC - Abnormal; Notable for the following components:      Result Value   Color, Urine AMBER (*)    Nitrite POSITIVE (*)    All other components within normal limits  BASIC METABOLIC PANEL - Abnormal; Notable for the following components:   Glucose, Bld 133 (*)    All other components within normal limits  URINE CULTURE  CBC WITH DIFFERENTIAL/PLATELET  LACTIC ACID, PLASMA    EKG None  Radiology CT Renal Stone Study  Result Date: 08/27/2020 CLINICAL DATA:  Flank pain, kidney stone suspected vs pyelonephritis vs cystitis UTI.  History of kidney stones. EXAM:  CT ABDOMEN AND PELVIS WITHOUT CONTRAST TECHNIQUE: Multidetector CT imaging of the abdomen and pelvis was performed following the standard protocol without IV contrast. COMPARISON:  01/24/2020 FINDINGS: Lower chest: The lung bases are clear. No acute airspace disease or pleural effusion. Heart size normal. Hepatobiliary: No focal liver abnormality is seen. No gallstones, gallbladder wall thickening, or biliary dilatation. Pancreas: No ductal dilatation or inflammation. Spleen: Normal in size without focal abnormality. Adrenals/Urinary Tract: Normal adrenal glands. No hydronephrosis or renal calculi. Both ureters are decompressed without stones along the course. Mild symmetric perinephric edema. No evidence of focal  renal collection or lesion. Decompressed urinary bladder. No bladder stone. Stomach/Bowel: Tiny hiatal hernia. Unremarkable stomach. No small bowel obstruction or inflammation. Appendectomy. Moderate colonic stool burden. Diverticulosis of the descending and sigmoid colon. Mild mural hypertrophy without focal diverticulitis or pericolonic inflammation. Vascular/Lymphatic: Aortic atherosclerosis. No aneurysm. No abdominopelvic adenopathy. Reproductive: Uterus and bilateral adnexa are unremarkable. Other: Prior ventral abdominal wall hernia repair. No ascites. No free air. Musculoskeletal: There are no acute or suspicious osseous abnormalities. Lower lumbar facet hypertrophy. IMPRESSION: 1. No renal stones or obstructive uropathy. 2. Mild symmetric perinephric edema is nonspecific, but can be seen with urinary tract infection. No evidence of renal fluid collection. 3. Colonic diverticulosis without acute inflammation. Aortic Atherosclerosis (ICD10-I70.0). Electronically Signed   By: Narda Rutherford M.D.   On: 08/27/2020 19:20    Procedures Procedures   Medications Ordered in ED Medications  sodium chloride 0.9 % bolus 1,000 mL (0 mLs Intravenous Stopped 08/27/20 1814)  ondansetron (ZOFRAN) injection  4 mg (4 mg Intravenous Given 08/27/20 1634)  fentaNYL (SUBLIMAZE) injection 50 mcg (50 mcg Intravenous Given 08/27/20 1634)  tiZANidine (ZANAFLEX) tablet 4 mg (4 mg Oral Given 08/27/20 1816)  oxyCODONE-acetaminophen (PERCOCET/ROXICET) 5-325 MG per tablet 1 tablet (1 tablet Oral Given 08/27/20 2052)  cefTRIAXone (ROCEPHIN) 1 g in sodium chloride 0.9 % 100 mL IVPB (0 g Intravenous Stopped 08/27/20 2124)  ondansetron (ZOFRAN) injection 4 mg (4 mg Intravenous Given 08/27/20 2052)    ED Course  I have reviewed the triage vital signs and the nursing notes.  Pertinent labs & imaging results that were available during my care of the patient were reviewed by me and considered in my medical decision making (see chart for details).  Clinical Course as of 08/27/20 2143  Tue Aug 27, 2020  1649 BP(!): 96/59 BP cuff was poorly positioned on evaluation.  It was repositioned and she is normotensive on recheck.  Question if this is accurate.  Patient states she had normal blood pressure at home prior to arrival as well. [JR]    Clinical Course User Index [JR] Vickki Igou, [place] N, PA-C   MDM Rules/Calculators/A&P                          Patient is presenting for evaluation of potential UTI versus pyelonephritis.  Has had been having urinary symptoms with bilateral flank pain, worse on the right over the week.  Had UA done at the [place] on Wednesday, was just now called today for abnormal results.  States she has been gradually feeling worse.  She is not febrile though.  She has been having nausea and vomiting.  Ischial BP was documented as low, however with BP cuff repositioning she is normotensive here and has remained normotensive throughout ED stay.  This is unlikely to be accurate.  She is also afebrile, no tachycardia.  She is overall well-appearing.  Blood work is reassuring.  UA is nitrite positive though not strongly indicating UTI.  For this reason CT was obtained for further assessment.  CT shows nephric  stranding bilaterally, no other acute findings.    Will cover for pyelonephritis considering patient's symptoms, nitrite positive UA, and CT results.  Though believe she is appropriate for outpatient treatment.  She is given a dose of Rocephin here.  Will discharge with short course of pain medication as she is allergic to NSAIDs and Tylenol is not providing continuous adequate relief of symptoms at home.  Close outpatient follow up encouraged. Pt  tolerating PO fluids in the ED. Discharged with strict return precautions.   Discussed results, findings, treatment and follow up. Patient advised of return precautions. Patient verbalized understanding and agreed with plan.  Discussed patient with Dr. Rodena Medin, who agrees with workup and care plan.  Final Clinical Impression(s) / ED Diagnoses Final diagnoses:  Pyelonephritis    Rx / DC Orders ED Discharge Orders         Ordered    ondansetron (ZOFRAN ODT) 4 MG disintegrating tablet  Every 8 hours PRN        08/27/20 2143    oxyCODONE (ROXICODONE) 5 MG immediate release tablet  Every 6 hours PRN        08/27/20 2143    cephALEXin (KEFLEX) 500 MG capsule  4 times daily        08/27/20 2143           Avaline Stillson, [place] N, PA-C 08/27/20 2143    Wynetta Fines, MD 08/28/20 2259

## 2020-08-27 NOTE — ED Triage Notes (Signed)
Pt bibems due to abnormal labs. She got labs drawn at the [place] and they called her to tell her that she has a bladder/kidney infection and needed to go to the er. Pt is alert and oriented x4. Pt is incontinent bowel/bladder due to prior HX.

## 2020-08-28 ENCOUNTER — Telehealth: Payer: Self-pay | Admitting: *Deleted

## 2020-08-28 NOTE — Telephone Encounter (Signed)
Transition Care Management Follow-up Telephone Call  Date of discharge and from where: 08/27/2020 - [facility]  How have you been since you were released from the hospital? "Okay"  Any questions or concerns? No  Items Reviewed:  Did the pt receive and understand the discharge instructions provided? Yes   Medications obtained and verified? Yes   Other? No   Any new allergies since your discharge? No   Dietary orders reviewed? No  Do you have support at home? Yes    Functional Questionnaire: (I = Independent and D = Dependent) ADLs: I  Bathing/Dressing- I  Meal Prep- I  Eating- I  Maintaining continence- I  Transferring/Ambulation- I  Managing Meds- I  Follow up appointments reviewed:   PCP Hospital f/u appt confirmed? No  - PCP outside of [facility] f/u appt confirmed? No    Are transportation arrangements needed? No   If their condition worsens, is the pt aware to call PCP or go to the Emergency Dept.? Yes  Was the patient provided with contact information for the PCP's office or ED? Yes  Was to pt encouraged to call back with questions or concerns? Yes

## 2020-08-29 LAB — URINE CULTURE: Culture: >=100000 — AB

## 2020-08-30 ENCOUNTER — Telehealth: Payer: Self-pay | Admitting: Emergency Medicine

## 2020-08-30 NOTE — Telephone Encounter (Signed)
Post ED Visit - Positive Culture Follow-up  Culture report reviewed by antimicrobial stewardship pharmacist: [facility] []  , Pharm.D. []  Enzo Bi, .D., BCPS AQ-ID []  Celedonio Miyamoto, Pharm.D., BCPS []  [address], Pharm.D., BCPS []  [place], Garvin Fila.D., BCPS, AAHIVP []  , Pharm.D., BCPS, AAHIVP []  Georgina Pillion, PharmD, BCPS []  , PharmD, BCPS []  [place], PharmD, BCPS [x]  [place], PharmD []  , PharmD, BCPS []  Estella Husk, PharmD  Pharmacy Team []  Lysle Pearl, PharmD []  , PharmD []  Phillips Climes, PharmD []  , Rph []  Agapito Games) , PharmD []  Pervis Hocking, PharmD []  , PharmD []  Mervyn Gay, PharmD []  , PharmD []  Vinnie Level, PharmD []  [facility], PharmD []  , PharmD []  Len Childs, PharmD   Positive urine culture Treated with Cephalexin, organism sensitive to the same and no further patient follow-up is required at this time.  Chesni Vos 08/30/2020, 9:59 AM

## 2020-11-26 ENCOUNTER — Ambulatory Visit: Payer: No Typology Code available for payment source | Admitting: Obstetrics and Gynecology

## 2021-04-07 DIAGNOSIS — N3946 Mixed incontinence: Secondary | ICD-10-CM | POA: Diagnosis not present

## 2021-06-10 DIAGNOSIS — N816 Rectocele: Secondary | ICD-10-CM | POA: Diagnosis not present

## 2021-06-10 DIAGNOSIS — Z886 Allergy status to analgesic agent status: Secondary | ICD-10-CM | POA: Diagnosis not present

## 2021-06-10 DIAGNOSIS — K5900 Constipation, unspecified: Secondary | ICD-10-CM | POA: Diagnosis not present

## 2021-06-10 DIAGNOSIS — T8119XA Other postprocedural shock, initial encounter: Secondary | ICD-10-CM | POA: Diagnosis not present

## 2021-06-10 DIAGNOSIS — E119 Type 2 diabetes mellitus without complications: Secondary | ICD-10-CM | POA: Diagnosis not present

## 2021-06-10 DIAGNOSIS — E785 Hyperlipidemia, unspecified: Secondary | ICD-10-CM | POA: Diagnosis not present

## 2021-06-10 DIAGNOSIS — Z90721 Acquired absence of ovaries, unilateral: Secondary | ICD-10-CM | POA: Diagnosis not present

## 2021-06-10 DIAGNOSIS — Z419 Encounter for procedure for purposes other than remedying health state, unspecified: Secondary | ICD-10-CM | POA: Diagnosis not present

## 2021-06-10 DIAGNOSIS — N99840 Postprocedural hematoma of a genitourinary system organ or structure following a genitourinary system procedure: Secondary | ICD-10-CM | POA: Diagnosis not present

## 2021-06-10 DIAGNOSIS — N3946 Mixed incontinence: Secondary | ICD-10-CM | POA: Diagnosis not present

## 2021-06-10 DIAGNOSIS — I10 Essential (primary) hypertension: Secondary | ICD-10-CM | POA: Diagnosis not present

## 2021-06-10 DIAGNOSIS — D62 Acute posthemorrhagic anemia: Secondary | ICD-10-CM | POA: Diagnosis not present

## 2021-06-10 DIAGNOSIS — Z96653 Presence of artificial knee joint, bilateral: Secondary | ICD-10-CM | POA: Diagnosis not present

## 2021-06-10 DIAGNOSIS — Z87891 Personal history of nicotine dependence: Secondary | ICD-10-CM | POA: Diagnosis not present

## 2021-06-10 DIAGNOSIS — Z98891 History of uterine scar from previous surgery: Secondary | ICD-10-CM | POA: Diagnosis not present

## 2021-06-10 DIAGNOSIS — N171 Acute kidney failure with acute cortical necrosis: Secondary | ICD-10-CM | POA: Diagnosis not present

## 2021-06-10 DIAGNOSIS — Z79899 Other long term (current) drug therapy: Secondary | ICD-10-CM | POA: Diagnosis not present

## 2021-06-10 DIAGNOSIS — Z9079 Acquired absence of other genital organ(s): Secondary | ICD-10-CM | POA: Diagnosis not present

## 2021-06-10 DIAGNOSIS — Z888 Allergy status to other drugs, medicaments and biological substances status: Secondary | ICD-10-CM | POA: Diagnosis not present

## 2021-06-10 DIAGNOSIS — R159 Full incontinence of feces: Secondary | ICD-10-CM | POA: Diagnosis not present

## 2021-06-10 DIAGNOSIS — Z7984 Long term (current) use of oral hypoglycemic drugs: Secondary | ICD-10-CM | POA: Diagnosis not present

## 2021-06-10 DIAGNOSIS — R778 Other specified abnormalities of plasma proteins: Secondary | ICD-10-CM | POA: Diagnosis not present

## 2021-06-10 DIAGNOSIS — R911 Solitary pulmonary nodule: Secondary | ICD-10-CM | POA: Diagnosis not present

## 2021-06-10 DIAGNOSIS — N393 Stress incontinence (female) (male): Secondary | ICD-10-CM | POA: Diagnosis not present

## 2021-06-10 DIAGNOSIS — Z9089 Acquired absence of other organs: Secondary | ICD-10-CM | POA: Diagnosis not present

## 2021-06-10 DIAGNOSIS — N3289 Other specified disorders of bladder: Secondary | ICD-10-CM | POA: Diagnosis not present

## 2021-06-10 DIAGNOSIS — I959 Hypotension, unspecified: Secondary | ICD-10-CM | POA: Diagnosis not present

## 2021-06-11 DIAGNOSIS — Z90710 Acquired absence of both cervix and uterus: Secondary | ICD-10-CM | POA: Diagnosis not present

## 2021-06-11 DIAGNOSIS — N171 Acute kidney failure with acute cortical necrosis: Secondary | ICD-10-CM | POA: Diagnosis not present

## 2021-06-11 DIAGNOSIS — R102 Pelvic and perineal pain: Secondary | ICD-10-CM | POA: Diagnosis not present

## 2021-06-11 DIAGNOSIS — N210 Calculus in bladder: Secondary | ICD-10-CM | POA: Diagnosis not present

## 2021-06-11 DIAGNOSIS — Z419 Encounter for procedure for purposes other than remedying health state, unspecified: Secondary | ICD-10-CM | POA: Diagnosis not present

## 2021-06-11 DIAGNOSIS — N816 Rectocele: Secondary | ICD-10-CM | POA: Diagnosis not present

## 2021-06-11 DIAGNOSIS — T8119XA Other postprocedural shock, initial encounter: Secondary | ICD-10-CM | POA: Diagnosis not present

## 2021-06-11 DIAGNOSIS — N3946 Mixed incontinence: Secondary | ICD-10-CM | POA: Diagnosis not present

## 2021-06-12 DIAGNOSIS — D62 Acute posthemorrhagic anemia: Secondary | ICD-10-CM | POA: Diagnosis not present

## 2021-06-12 DIAGNOSIS — E119 Type 2 diabetes mellitus without complications: Secondary | ICD-10-CM | POA: Diagnosis not present

## 2021-06-12 DIAGNOSIS — N171 Acute kidney failure with acute cortical necrosis: Secondary | ICD-10-CM | POA: Diagnosis not present

## 2021-06-12 DIAGNOSIS — T8119XA Other postprocedural shock, initial encounter: Secondary | ICD-10-CM | POA: Diagnosis not present

## 2021-06-12 DIAGNOSIS — Z419 Encounter for procedure for purposes other than remedying health state, unspecified: Secondary | ICD-10-CM | POA: Diagnosis not present

## 2021-06-12 DIAGNOSIS — N816 Rectocele: Secondary | ICD-10-CM | POA: Diagnosis not present

## 2021-06-17 ENCOUNTER — Emergency Department (HOSPITAL_COMMUNITY): Payer: No Typology Code available for payment source

## 2021-06-17 ENCOUNTER — Inpatient Hospital Stay (HOSPITAL_COMMUNITY)
Admission: EM | Admit: 2021-06-17 | Discharge: 2021-06-19 | DRG: 863 | Disposition: A | Discharge status: Home / Self Care | Payer: No Typology Code available for payment source | Attending: Family Medicine | Admitting: Family Medicine

## 2021-06-17 ENCOUNTER — Other Ambulatory Visit: Payer: Self-pay

## 2021-06-17 ENCOUNTER — Encounter (HOSPITAL_COMMUNITY): Payer: Self-pay

## 2021-06-17 DIAGNOSIS — Z87891 Personal history of nicotine dependence: Secondary | ICD-10-CM

## 2021-06-17 DIAGNOSIS — E669 Obesity, unspecified: Secondary | ICD-10-CM | POA: Diagnosis present

## 2021-06-17 DIAGNOSIS — R6520 Severe sepsis without septic shock: Secondary | ICD-10-CM | POA: Diagnosis present

## 2021-06-17 DIAGNOSIS — E66811 Obesity, class 1: Secondary | ICD-10-CM | POA: Diagnosis present

## 2021-06-17 DIAGNOSIS — N9489 Other specified conditions associated with female genital organs and menstrual cycle: Secondary | ICD-10-CM | POA: Diagnosis not present

## 2021-06-17 DIAGNOSIS — I700 Atherosclerosis of aorta: Secondary | ICD-10-CM | POA: Diagnosis present

## 2021-06-17 DIAGNOSIS — I517 Cardiomegaly: Secondary | ICD-10-CM | POA: Diagnosis not present

## 2021-06-17 DIAGNOSIS — R8271 Bacteriuria: Secondary | ICD-10-CM

## 2021-06-17 DIAGNOSIS — M1990 Unspecified osteoarthritis, unspecified site: Secondary | ICD-10-CM | POA: Diagnosis present

## 2021-06-17 DIAGNOSIS — Z7984 Long term (current) use of oral hypoglycemic drugs: Secondary | ICD-10-CM

## 2021-06-17 DIAGNOSIS — Z6831 Body mass index (BMI) 31.0-31.9, adult: Secondary | ICD-10-CM

## 2021-06-17 DIAGNOSIS — I10 Essential (primary) hypertension: Secondary | ICD-10-CM | POA: Diagnosis not present

## 2021-06-17 DIAGNOSIS — N99840 Postprocedural hematoma of a genitourinary system organ or structure following a genitourinary system procedure: Secondary | ICD-10-CM | POA: Diagnosis present

## 2021-06-17 DIAGNOSIS — D62 Acute posthemorrhagic anemia: Secondary | ICD-10-CM | POA: Diagnosis present

## 2021-06-17 DIAGNOSIS — E1165 Type 2 diabetes mellitus with hyperglycemia: Secondary | ICD-10-CM | POA: Diagnosis present

## 2021-06-17 DIAGNOSIS — T8144XA Sepsis following a procedure, initial encounter: Principal | ICD-10-CM | POA: Diagnosis present

## 2021-06-17 DIAGNOSIS — Z886 Allergy status to analgesic agent status: Secondary | ICD-10-CM

## 2021-06-17 DIAGNOSIS — A419 Sepsis, unspecified organism: Secondary | ICD-10-CM | POA: Diagnosis not present

## 2021-06-17 DIAGNOSIS — R509 Fever, unspecified: Secondary | ICD-10-CM | POA: Diagnosis not present

## 2021-06-17 DIAGNOSIS — R32 Unspecified urinary incontinence: Secondary | ICD-10-CM | POA: Diagnosis present

## 2021-06-17 DIAGNOSIS — Z20822 Contact with and (suspected) exposure to covid-19: Secondary | ICD-10-CM | POA: Diagnosis present

## 2021-06-17 DIAGNOSIS — E785 Hyperlipidemia, unspecified: Secondary | ICD-10-CM | POA: Diagnosis present

## 2021-06-17 DIAGNOSIS — Z981 Arthrodesis status: Secondary | ICD-10-CM

## 2021-06-17 DIAGNOSIS — Y838 Other surgical procedures as the cause of abnormal reaction of the patient, or of later complication, without mention of misadventure at the time of the procedure: Secondary | ICD-10-CM | POA: Diagnosis present

## 2021-06-17 DIAGNOSIS — Y763 Surgical instruments, materials and obstetric and gynecological devices (including sutures) associated with adverse incidents: Secondary | ICD-10-CM | POA: Diagnosis present

## 2021-06-17 DIAGNOSIS — L089 Local infection of the skin and subcutaneous tissue, unspecified: Secondary | ICD-10-CM | POA: Diagnosis present

## 2021-06-17 DIAGNOSIS — Z91018 Allergy to other foods: Secondary | ICD-10-CM

## 2021-06-17 DIAGNOSIS — F4310 Post-traumatic stress disorder, unspecified: Secondary | ICD-10-CM | POA: Diagnosis present

## 2021-06-17 DIAGNOSIS — T148XXA Other injury of unspecified body region, initial encounter: Secondary | ICD-10-CM | POA: Diagnosis present

## 2021-06-17 DIAGNOSIS — Z79899 Other long term (current) drug therapy: Secondary | ICD-10-CM | POA: Diagnosis not present

## 2021-06-17 DIAGNOSIS — R000 Tachycardia, unspecified: Secondary | ICD-10-CM | POA: Diagnosis not present

## 2021-06-17 LAB — COMPREHENSIVE METABOLIC PANEL
ALT: 10 U/L (ref 0–44)
AST: 20 U/L (ref 15–41)
Albumin: 4 g/dL (ref 3.5–5.0)
Alkaline Phosphatase: 62 U/L (ref 38–126)
Anion gap: 9 (ref 5–15)
BUN: 17 mg/dL (ref 8–23)
CO2: 28 mmol/L (ref 22–32)
Calcium: 9.2 mg/dL (ref 8.9–10.3)
Chloride: 99 mmol/L (ref 98–111)
Creatinine, Ser: 0.95 mg/dL (ref 0.44–1.00)
GFR, Estimated: >60 mL/min (ref >60)
Glucose, Bld: 239 mg/dL — ABNORMAL HIGH (ref 70–99)
Potassium: 4.1 mmol/L (ref 3.5–5.1)
Sodium: 136 mmol/L (ref 135–145)
Total Bilirubin: 0.8 mg/dL (ref 0.3–1.2)
Total Protein: 7.1 g/dL (ref 6.5–8.1)

## 2021-06-17 LAB — URINALYSIS, ROUTINE W REFLEX MICROSCOPIC
Bacteria, UA: NONE SEEN
Bilirubin Urine: NEGATIVE
Glucose, UA: NEGATIVE mg/dL
Ketones, ur: NEGATIVE mg/dL
Nitrite: NEGATIVE
Protein, ur: NEGATIVE mg/dL
RBC / HPF: >50 RBC/hpf — ABNORMAL HIGH (ref 0–5)
Specific Gravity, Urine: 1.018 (ref 1.005–1.030)
pH: 5 (ref 5.0–8.0)

## 2021-06-17 LAB — CBC WITH DIFFERENTIAL/PLATELET
Abs Immature Granulocytes: 0.04 10*3/uL (ref 0.00–0.07)
Basophils Absolute: 0.1 10*3/uL (ref 0.0–0.1)
Basophils Relative: 1 %
Eosinophils Absolute: 0.3 10*3/uL (ref 0.0–0.5)
Eosinophils Relative: 2 %
HCT: 34.4 % — ABNORMAL LOW (ref 36.0–46.0)
Hemoglobin: 11.3 g/dL — ABNORMAL LOW (ref 12.0–15.0)
Immature Granulocytes: 0 %
Lymphocytes Relative: 18 %
Lymphs Abs: 2.3 10*3/uL (ref 0.7–4.0)
MCH: 30.4 pg (ref 26.0–34.0)
MCHC: 32.8 g/dL (ref 30.0–36.0)
MCV: 92.5 fL (ref 80.0–100.0)
Monocytes Absolute: 1 10*3/uL (ref 0.1–1.0)
Monocytes Relative: 8 %
Neutro Abs: 8.9 10*3/uL — ABNORMAL HIGH (ref 1.7–7.7)
Neutrophils Relative %: 71 %
Platelets: 233 10*3/uL (ref 150–400)
RBC: 3.72 MIL/uL — ABNORMAL LOW (ref 3.87–5.11)
RDW: 13.4 % (ref 11.5–15.5)
WBC: 12.6 10*3/uL — ABNORMAL HIGH (ref 4.0–10.5)
nRBC: 0 % (ref 0.0–0.2)

## 2021-06-17 LAB — PROTIME-INR
INR: 1.2 (ref 0.8–1.2)
Prothrombin Time: 15.5 seconds — ABNORMAL HIGH (ref 11.4–15.2)

## 2021-06-17 LAB — LACTIC ACID, PLASMA
Lactic Acid, Venous: 2 mmol/L (ref 0.5–1.9)
Lactic Acid, Venous: 1.9 mmol/L (ref 0.5–1.9)

## 2021-06-17 LAB — MAGNESIUM: Magnesium: 2 mg/dL (ref 1.7–2.4)

## 2021-06-17 LAB — HEMOGLOBIN AND HEMATOCRIT, BLOOD
HCT: 36.9 % (ref 36.0–46.0)
Hemoglobin: 12 g/dL (ref 12.0–15.0)

## 2021-06-17 LAB — GLUCOSE, CAPILLARY
Glucose-Capillary: 139 mg/dL — ABNORMAL HIGH (ref 70–99)
Glucose-Capillary: 128 mg/dL — ABNORMAL HIGH (ref 70–99)

## 2021-06-17 LAB — RESP PANEL BY RT-PCR (FLU A&B, COVID) ARPGX2
Influenza A by PCR: NEGATIVE
Influenza B by PCR: NEGATIVE
SARS Coronavirus 2 by RT PCR: NEGATIVE

## 2021-06-17 LAB — APTT: aPTT: 27 seconds (ref 24–36)

## 2021-06-17 LAB — PHOSPHORUS: Phosphorus: 3.4 mg/dL (ref 2.5–4.6)

## 2021-06-17 MED ORDER — ONDANSETRON HCL 4 MG/2ML IJ SOLN
4.0000 mg | Freq: Once | INTRAMUSCULAR | Status: AC
  Administered 2021-06-17: 4 mg via INTRAVENOUS
  Filled 2021-06-17: qty 2

## 2021-06-17 MED ORDER — HYDROMORPHONE HCL 1 MG/ML IJ SOLN
1.0000 mg | Freq: Once | INTRAMUSCULAR | Status: AC
  Administered 2021-06-17: 1 mg via INTRAVENOUS
  Filled 2021-06-17: qty 1

## 2021-06-17 MED ORDER — SODIUM CHLORIDE 0.9 % IV SOLN
2.0000 g | Freq: Three times a day (TID) | INTRAVENOUS | Status: DC
  Administered 2021-06-17 – 2021-06-19 (×6): 2 g via INTRAVENOUS
  Filled 2021-06-17 (×7): qty 2

## 2021-06-17 MED ORDER — DIPHENHYDRAMINE HCL 25 MG PO CAPS
25.0000 mg | ORAL_CAPSULE | ORAL | Status: DC | PRN
  Administered 2021-06-17 – 2021-06-19 (×3): 25 mg via ORAL
  Filled 2021-06-17 (×3): qty 1

## 2021-06-17 MED ORDER — ONDANSETRON HCL 4 MG/2ML IJ SOLN
4.0000 mg | Freq: Four times a day (QID) | INTRAMUSCULAR | Status: DC | PRN
  Administered 2021-06-17 – 2021-06-19 (×2): 4 mg via INTRAVENOUS
  Filled 2021-06-17 (×2): qty 2

## 2021-06-17 MED ORDER — ACETAMINOPHEN 325 MG PO TABS
650.0000 mg | ORAL_TABLET | Freq: Four times a day (QID) | ORAL | Status: DC | PRN
  Administered 2021-06-17 – 2021-06-19 (×5): 650 mg via ORAL
  Filled 2021-06-17 (×5): qty 2

## 2021-06-17 MED ORDER — VANCOMYCIN HCL 2000 MG/400ML IV SOLN
2000.0000 mg | Freq: Once | INTRAVENOUS | Status: AC
  Administered 2021-06-17: 2000 mg via INTRAVENOUS
  Filled 2021-06-17 (×2): qty 400

## 2021-06-17 MED ORDER — METRONIDAZOLE 500 MG/100ML IV SOLN
500.0000 mg | Freq: Once | INTRAVENOUS | Status: AC
  Administered 2021-06-17: 500 mg via INTRAVENOUS
  Filled 2021-06-17: qty 100

## 2021-06-17 MED ORDER — ONDANSETRON HCL 4 MG PO TABS: 4.0000 mg | ORAL_TABLET | Freq: Four times a day (QID) | ORAL | Status: DC | PRN

## 2021-06-17 MED ORDER — VANCOMYCIN HCL IN DEXTROSE 1-5 GM/200ML-% IV SOLN: 1000.0000 mg | Freq: Once | INTRAVENOUS | Status: DC

## 2021-06-17 MED ORDER — SODIUM CHLORIDE (PF) 0.9 % IJ SOLN
INTRAMUSCULAR | Status: AC
  Filled 2021-06-17: qty 50

## 2021-06-17 MED ORDER — ATORVASTATIN CALCIUM 40 MG PO TABS
40.0000 mg | ORAL_TABLET | Freq: Every day | ORAL | Status: DC
  Administered 2021-06-17 – 2021-06-19 (×3): 40 mg via ORAL
  Filled 2021-06-17 (×3): qty 1

## 2021-06-17 MED ORDER — LACTATED RINGERS IV SOLN: INTRAVENOUS | Status: AC

## 2021-06-17 MED ORDER — VALACYCLOVIR HCL 500 MG PO TABS
500.0000 mg | ORAL_TABLET | Freq: Every day | ORAL | Status: DC
  Administered 2021-06-17 – 2021-06-19 (×3): 500 mg via ORAL
  Filled 2021-06-17 (×3): qty 1

## 2021-06-17 MED ORDER — LINAGLIPTIN 5 MG PO TABS
5.0000 mg | ORAL_TABLET | Freq: Every day | ORAL | Status: DC
  Administered 2021-06-17 – 2021-06-19 (×3): 5 mg via ORAL
  Filled 2021-06-17 (×3): qty 1

## 2021-06-17 MED ORDER — HYDROMORPHONE HCL 1 MG/ML IJ SOLN
1.0000 mg | INTRAMUSCULAR | Status: DC | PRN
  Administered 2021-06-17 – 2021-06-19 (×5): 1 mg via INTRAVENOUS
  Filled 2021-06-17 (×5): qty 1

## 2021-06-17 MED ORDER — GABAPENTIN 400 MG PO CAPS
800.0000 mg | ORAL_CAPSULE | Freq: Three times a day (TID) | ORAL | Status: DC
  Administered 2021-06-17 – 2021-06-19 (×6): 800 mg via ORAL
  Filled 2021-06-17 (×6): qty 2

## 2021-06-17 MED ORDER — TIZANIDINE HCL 4 MG PO TABS
4.0000 mg | ORAL_TABLET | Freq: Four times a day (QID) | ORAL | Status: DC | PRN
  Administered 2021-06-17 – 2021-06-19 (×6): 8 mg via ORAL
  Filled 2021-06-17 (×6): qty 2

## 2021-06-17 MED ORDER — DOCUSATE SODIUM 100 MG PO CAPS: 100.0000 mg | ORAL_CAPSULE | Freq: Every day | ORAL | Status: DC | PRN

## 2021-06-17 MED ORDER — POLYETHYLENE GLYCOL 3350 17 G PO PACK: 17.0000 g | PACK | Freq: Every day | ORAL | Status: DC | PRN

## 2021-06-17 MED ORDER — MORPHINE SULFATE (PF) 4 MG/ML IV SOLN
4.0000 mg | Freq: Once | INTRAVENOUS | Status: AC
  Administered 2021-06-17: 4 mg via INTRAVENOUS
  Filled 2021-06-17: qty 1

## 2021-06-17 MED ORDER — SODIUM CHLORIDE 0.9 % IV SOLN
2.0000 g | Freq: Once | INTRAVENOUS | Status: AC
  Administered 2021-06-17: 2 g via INTRAVENOUS
  Filled 2021-06-17: qty 2

## 2021-06-17 MED ORDER — METRONIDAZOLE 500 MG/100ML IV SOLN
500.0000 mg | Freq: Two times a day (BID) | INTRAVENOUS | Status: DC
  Administered 2021-06-17 – 2021-06-19 (×4): 500 mg via INTRAVENOUS
  Filled 2021-06-17 (×4): qty 100

## 2021-06-17 MED ORDER — MORPHINE SULFATE (PF) 4 MG/ML IV SOLN
4.0000 mg | INTRAVENOUS | Status: DC | PRN
  Administered 2021-06-17: 4 mg via INTRAVENOUS
  Filled 2021-06-17: qty 1

## 2021-06-17 MED ORDER — VANCOMYCIN HCL 1250 MG/250ML IV SOLN
1250.0000 mg | INTRAVENOUS | Status: DC
  Filled 2021-06-17: qty 250

## 2021-06-17 MED ORDER — IOHEXOL 300 MG/ML  SOLN
100.0000 mL | Freq: Once | INTRAMUSCULAR | Status: AC | PRN
  Administered 2021-06-17: 100 mL via INTRAVENOUS

## 2021-06-17 MED ORDER — OXYCODONE HCL 5 MG PO TABS
10.0000 mg | ORAL_TABLET | Freq: Four times a day (QID) | ORAL | Status: DC | PRN
  Administered 2021-06-17 – 2021-06-19 (×6): 10 mg via ORAL
  Filled 2021-06-17 (×6): qty 2

## 2021-06-17 MED ORDER — ALOGLIPTIN BENZOATE 12.5 MG PO TABS: 12.5000 mg | ORAL_TABLET | Freq: Every day | ORAL | Status: DC

## 2021-06-17 MED ORDER — ACETAMINOPHEN 650 MG RE SUPP: 650.0000 mg | Freq: Four times a day (QID) | RECTAL | Status: DC | PRN

## 2021-06-17 MED ORDER — LACTATED RINGERS IV BOLUS (SEPSIS)
1500.0000 mL | Freq: Once | INTRAVENOUS | Status: AC
  Administered 2021-06-17: 1500 mL via INTRAVENOUS

## 2021-06-17 MED ORDER — LACTATED RINGERS IV BOLUS
1000.0000 mL | Freq: Once | INTRAVENOUS | Status: AC
  Administered 2021-06-17: 1000 mL via INTRAVENOUS

## 2021-06-17 MED ORDER — INSULIN ASPART 100 UNIT/ML IJ SOLN
0.0000 [IU] | Freq: Three times a day (TID) | INTRAMUSCULAR | Status: DC
  Administered 2021-06-18: 2 [IU] via SUBCUTANEOUS
  Administered 2021-06-18: 3 [IU] via SUBCUTANEOUS
  Administered 2021-06-18: 2 [IU] via SUBCUTANEOUS
  Administered 2021-06-19: 3 [IU] via SUBCUTANEOUS

## 2021-06-17 NOTE — Progress Notes (Signed)
Transition of Care (TOC) Screening Note ? ?Patient Details  ?Name: Jane Murphy ?Date of Birth: 10/18/1955 ? ?Transition of Care (TOC) CM/SW Contact:    ?Ewing Schlein, LCSW ?Phone Number: ?06/17/2021, 11:04 AM ? ?Transition of Care Department [facility]) has reviewed patient and no TOC needs have been identified at this time. We will continue to monitor patient advancement through interdisciplinary progression rounds. If new patient transition needs arise, please place a TOC consult. ?

## 2021-06-17 NOTE — H&P (Signed)
?History and Physical  ? ? ?PatientMakiyah Murphy OZH:[id number] DOB: 01/23/1956 ?DOA: 06/17/2021 ?DOS: the patient was seen and examined on 06/17/2021 ?PCP: [facility]  ?Patient coming from: Home ? ?Chief Complaint:  ?Chief Complaint  ?Patient presents with  ? Fever  ? ?HPI: Jane Murphy is a 66 y.o. female with medical history significant of osteoarthritis, hypertension, hyperlipidemia, nephrolithiasis, PTSD, herpes zoster, class I obesity who underwent a hysterectomy about a week ago at [facility] complicated postop bleeding requiring 3 units of PRBC who is coming to the emergency department due to abdominal pain and fever since yesterday evening.   No dysuria, frequency or hematuria.  She stated she has actually been having much better control of her urinary sphincter now.  Denied dyspnea, chest pain, dizziness, palpitations, diaphoresis, PND, orthopnea or recent pitting edema of the lower extremities.  No polyuria, polydipsia, polyphagia or blurred vision. ? ?ED course: Initial vital signs were temperature 100.8 ?F, pulse 98, respiration 19, BP 167/93 mmHg and O2 sat 95% on room air.  The patient received morphine 4 mg IVP, ondansetron 4 mg IVP, hydromorphone 1 mg IVP, cefepime, vancomycin and metronidazole along with 2500 mL of LR bolus. ? ?Labwork: Her urinalysis showed large hemoglobinuria and large leukocyte esterase with microscopic examination showing more than 50 RBC and 21-50 WBC with no bacteria.  CBC is her white count 12.6, hemoglobin 11.3 g/dL platelets 233.  PT 15.5, INR 1.2 and PTT 27.  CMP is only remarkable for glucose of 239 mg/dL.  Lactic acid was 2.0 and then 1.9 mmol/L.  Coronavirus and influenza PCR was negative. ? ?Imaging: CT abdomen/pelvis with contrast showed status post recent total abdominal hysterectomy and right salpingo-oophorectomy.  No specific findings identified to suggest postop abscess.  No bowel obstruction.  There was 6.9 x 3.2 x 4.3 cm  hematoma within the left pelvic sidewall which is likely postoperative.  There was a small collection posterior to the vaginal cough measures 2 x 1.4 cm.  And this is slightly [place] a small seroma.  Mildly prominent left external iliac lymph nodes, sigmoid diverticulosis with no diverticulitis and aortic atherosclerosis.  Please see images and full radiology report for further details. ?  ?Review of Systems: As mentioned in the history of present illness. All other systems reviewed and are negative. ?Past Medical History:  ?Diagnosis Date  ? Arthritis   ? Hypertension   ? Kidney stone   ? Osteoarthritis   ? PTSD (post-traumatic stress disorder)   ? Rectocele   ? Shingles   ? Trigger finger of right thumb   ? ?Past Surgical History:  ?Procedure Laterality Date  ? APPENDECTOMY    ? C section    ? CHOLECYSTECTOMY    ? HERNIA REPAIR    ? KNEE SURGERY    ? OOPHORECTOMY    ? SPINAL FUSION    ? TONSILLECTOMY    ? ?Social History:  reports that she has quit smoking. She has never used smokeless tobacco. She reports current alcohol use of about 4.0 standard drinks per week. She reports that she does not use drugs. ? ?Allergies  ?Allergen Reactions  ? Aspirin Anaphylaxis  ? Nsaids Anaphylaxis  ? Other Anaphylaxis  ?  MSG  ? Food Hives and Other (See Comments)  ?  Citrus Fruit also causes mouth ulcers.  ? ? ?History reviewed. No pertinent family history. ? ?Prior to Admission medications   ?Medication Sig Start Date End Date Taking? Authorizing Provider  ?acetaminophen (TYLENOL)  500 MG tablet Take 1 tablet (500 mg total) by mouth 2 (two) times daily. ?Patient taking differently: Take 1,000 mg by mouth every 6 (six) hours as needed for mild pain.  04/22/18   Azzie Glatter, FNP  ?atorvastatin (LIPITOR) 10 MG tablet Take 10 mg by mouth daily.    [provider]  ?cephALEXin (KEFLEX) 500 MG capsule Take 1 capsule (500 mg total) by mouth 4 (four) times daily. 08/27/20   Robinson, [place] N, PA-C  ?fluticasone (FLONASE) 50  MCG/ACT nasal spray Place 1 spray into both nostrils daily as needed for allergies or rhinitis. 04/22/18   Azzie Glatter, FNP  ?losartan (COZAAR) 100 MG tablet Take 100 mg by mouth daily.    [provider]  ?metFORMIN (GLUCOPHAGE) 500 MG tablet Take 1 tablet (500 mg total) by mouth daily. 04/22/18   Azzie Glatter, FNP  ?ondansetron (ZOFRAN ODT) 4 MG disintegrating tablet Take 1 tablet (4 mg total) by mouth every 8 (eight) hours as needed for nausea or vomiting. 08/27/20   Robinson, [place] N, PA-C  ?oxyCODONE (ROXICODONE) 5 MG immediate release tablet Take 1 tablet (5 mg total) by mouth every 6 (six) hours as needed for severe pain. 08/27/20   Robinson, [place] N, PA-C  ?pantoprazole (PROTONIX) 20 MG tablet Take 1 tablet (20 mg total) by mouth daily. 06/13/19   Mitzi Hansen, MD  ?tiZANidine (ZANAFLEX) 2 MG tablet Take 1 tablet (2 mg total) by mouth every 6 (six) hours as needed for muscle spasms. 06/12/19   Mitzi Hansen, MD  ? ? ?Physical Exam: ?Vitals:  ? 06/17/21 0530 06/17/21 0600 06/17/21 0630 06/17/21 0749  ?BP: (!) 117/59 113/73  (!) 164/86  ?Pulse: 66 80 78 87  ?Resp: (!) $RemoveBe'21 20 19 17  'spsimodjs$ ?Temp:    99.8 ?F (37.7 ?C)  ?TempSrc:    Oral  ?SpO2: 91% 97% 94% 100%  ?Weight:      ?Height:      ? ?Physical Exam ?Vitals and nursing note reviewed.  ?Constitutional:   ?   Appearance: She is obese.  ?HENT:  ?   Head: Normocephalic.  ?   Mouth/Throat:  ?   Mouth: Mucous membranes are moist.  ?Eyes:  ?   General: No scleral icterus. ?   Pupils: Pupils are equal, round, and reactive to light.  ?Neck:  ?   Vascular: No JVD.  ?Cardiovascular:  ?   Rate and Rhythm: Normal rate and regular rhythm.  ?   Heart sounds: S1 normal and S2 normal.  ?Pulmonary:  ?   Effort: Pulmonary effort is normal.  ?   Breath sounds: Normal breath sounds.  ?Abdominal:  ?   General: Bowel sounds are normal. There is no distension.  ?   Palpations: Abdomen is soft.  ?   Tenderness: There is abdominal tenderness.  ?Musculoskeletal:  ?    Cervical back: Neck supple.  ?   Right lower leg: No edema.  ?   Left lower leg: No edema.  ?Skin: ?   General: Skin is warm and dry.  ?Neurological:  ?   General: No focal deficit present.  ?   Mental Status: She is alert and oriented to person, place, and time.  ?Psychiatric:     ?   Mood and Affect: Mood normal.     ?   Behavior: Behavior normal.  ? ? ?Data Reviewed: ? ?There are no new results to review at this time. ? ?Assessment and Plan: ?Principal Problem: ?  Pelvic hematoma, female ?Admit to MedSurg/inpatient. ?Continue IV fluids. ?Monitor H&H. ?Continue treatment for sepsis. ?Discussed with GYN. ?Please see GYN consult. ? ?Active Problems: ?  Sepsis due to undetermined organism POA ([place]) ?Met sepsis criteria with: ?Fever, pulse over 90, leukocytosis and lactic acidosis. ?Continue IV fluids. ?Continue broad-spectrum IV antibiotics. ?Follow-up blood culture and sensitivity. ? ?  Acute blood loss anemia ?Monitor hematocrit hemoglobin. ?Transfuse as needed. ? ?  Hyperlipidemia ?  Aortic atherosclerosis ([place]) ?Continue atorvastatin 40 mg p.o. daily. ? ?  Essential hypertension ?Monitor blood pressure. ?As needed antihypertensive. ? ?  Type 2 diabetes mellitus with hyperglycemia ([place]) ?Carbohydrate modified diet.Marland Kitchen ?CBG monitoring with RI SS. ?Continue alogliptin 12.5 mg p.o. daily. ?Check hemoglobin A1c. ? ?  Cardiomegaly ?Check echo given age and CVD risk factors. ? ?  Class 1 obesity ?Lifestyle modifications. ?Follow-up with PCP. ? ? ? ? Advance Care Planning:   Code Status: Full Code  ? ?Consults:  ? ?Family Communication:  ? ?Severity of Illness: ?The appropriate patient status for this patient is OBSERVATION. Observation status is judged to be reasonable and necessary in order to provide the required intensity of service to ensure the patient's safety. The patient's presenting symptoms, physical exam findings, and initial radiographic and laboratory data in the context of their medical condition is felt to  place them at decreased risk for further clinical deterioration. Furthermore, it is anticipated that the patient will be medically stable for discharge from the hospital within 2 midnights of admission.  ? ?

## 2021-06-17 NOTE — ED Triage Notes (Signed)
Pt BIB EMS with a fever that started today. Pt reports having a procedure last week with complications and hospitalization. 101.9 fever at home.  ?

## 2021-06-17 NOTE — ED Provider Notes (Signed)
?[facility]-EMERGENCY DEPT ?Provider Note ? ? ?CSN: [id number] ?Arrival date & time: 06/17/21  7829 ? ?  ? ?History ? ?Chief Complaint  ?Patient presents with  ? Fever  ? ? ?Jane Murphy is a 65 y.o. female. ? ?The history is provided by the patient.  ?Fever ?She has history of hypertension, hyperlipidemia and comes in because of fever and abdominal pain.  She was discharged from [facility] 3 days ago following hysterectomy complicated by postoperative bleeding and hypotension requiring blood transfusion.  Patient was doing reasonably well with usual postoperative pain until this afternoon when she had increased pain in her right lower quadrant and developed a fever to 101.9.  She denies chills but has had sweats.  She denies sore throat or cough.  She did take a home COVID test which was negative.  She has longstanding urinary incontinence and has not noted any change in her urination. ?  ?Home Medications ?Prior to Admission medications   ?Medication Sig Start Date End Date Taking? Authorizing Provider  ?acetaminophen (TYLENOL) 500 MG tablet Take 1 tablet (500 mg total) by mouth 2 (two) times daily. ?Patient taking differently: Take 1,000 mg by mouth every 6 (six) hours as needed for mild pain.  04/22/18   Kallie Locks, FNP  ?atorvastatin (LIPITOR) 10 MG tablet Take 10 mg by mouth daily.    [provider]  ?cephALEXin (KEFLEX) 500 MG capsule Take 1 capsule (500 mg total) by mouth 4 (four) times daily. 08/27/20   Robinson, [place] N, PA-C  ?fluticasone (FLONASE) 50 MCG/ACT nasal spray Place 1 spray into both nostrils daily as needed for allergies or rhinitis. 04/22/18   Kallie Locks, FNP  ?losartan (COZAAR) 100 MG tablet Take 100 mg by mouth daily.    [provider]  ?metFORMIN (GLUCOPHAGE) 500 MG tablet Take 1 tablet (500 mg total) by mouth daily. 04/22/18   Kallie Locks, FNP  ?ondansetron (ZOFRAN ODT) 4 MG disintegrating tablet Take 1 tablet (4 mg  total) by mouth every 8 (eight) hours as needed for nausea or vomiting. 08/27/20   Robinson, [place] N, PA-C  ?oxyCODONE (ROXICODONE) 5 MG immediate release tablet Take 1 tablet (5 mg total) by mouth every 6 (six) hours as needed for severe pain. 08/27/20   Robinson, [place] N, PA-C  ?pantoprazole (PROTONIX) 20 MG tablet Take 1 tablet (20 mg total) by mouth daily. 06/13/19   Elige Radon, MD  ?tiZANidine (ZANAFLEX) 2 MG tablet Take 1 tablet (2 mg total) by mouth every 6 (six) hours as needed for muscle spasms. 06/12/19   Elige Radon, MD  ?   ? ?Allergies    ?Aspirin, Nsaids, Other, and Food   ? ?Review of Systems   ?Review of Systems  ?Constitutional:  Positive for fever.  ?All other systems reviewed and are negative. ? ?Physical Exam ?Updated Vital Signs ?BP 103/63   Pulse 68   Temp (!) 100.8 ?F (38.2 ?C) (Oral)   Resp (!) 24   Ht 5\' 7"  (1.702 m)   Wt 90.7 kg   SpO2 90%   BMI 31.32 kg/m?  ?Physical Exam ?Vitals and nursing note reviewed.  ?66 year old female, resting comfortably and in no acute distress. Vital signs are significant for elevated temperature and borderline elevated respiratory rate. Oxygen saturation is 93%, which is normal. ?Head is normocephalic and atraumatic. PERRLA, EOMI. Oropharynx is clear. ?Neck is nontender and supple without adenopathy or JVD. ?Back is nontender and there is no CVA  tenderness. ?Lungs are clear without rales, wheezes, or rhonchi. ?Chest is nontender. ?Heart has regular rate and rhythm without murmur. ?Abdomen is soft, flat, with moderate tenderness in the right lower quadrant.  There is no rebound or guarding.  Peristalsis is diminished but present. ?Extremities have no cyanosis or edema, full range of motion is present. ?Skin is warm and dry without rash. ?Neurologic: Mental status is normal, cranial nerves are intact, moves all extremities equally. ? ?ED Results / Procedures / Treatments   ?Labs ?(all labs ordered are listed, but only abnormal results are  displayed) ?Labs Reviewed  ?CBC WITH DIFFERENTIAL/PLATELET - Abnormal; Notable for the following components:  ?    Result Value  ? WBC 12.6 (*)   ? RBC 3.72 (*)   ? Hemoglobin 11.3 (*)   ? HCT 34.4 (*)   ? Neutro Abs 8.9 (*)   ? All other components within normal limits  ?COMPREHENSIVE METABOLIC PANEL - Abnormal; Notable for the following components:  ? Glucose, Bld 239 (*)   ? All other components within normal limits  ?LACTIC ACID, PLASMA - Abnormal; Notable for the following components:  ? Lactic Acid, Venous 2.0 (*)   ? All other components within normal limits  ?PROTIME-INR - Abnormal; Notable for the following components:  ? Prothrombin Time 15.5 (*)   ? All other components within normal limits  ?URINALYSIS, ROUTINE W REFLEX MICROSCOPIC - Abnormal; Notable for the following components:  ? Hgb urine dipstick LARGE (*)   ? Leukocytes,Ua LARGE (*)   ? RBC / HPF >50 (*)   ? All other components within normal limits  ?RESP PANEL BY RT-PCR (FLU A&B, COVID) [id number]  ?CULTURE, BLOOD (ROUTINE X 2)  ?CULTURE, BLOOD (ROUTINE X 2)  ?URINE CULTURE  ?APTT  ?LACTIC ACID, PLASMA  ? ? ?EKG ?EKG Interpretation ? ?Date/Time:  Tuesday June 17 2021 03:24:37 EDT ?Ventricular Rate:  78 ?PR Interval:  143 ?QRS Duration: 101 ?QT Interval:  370 ?QTC Calculation: 422 ?R Axis:   15 ?Text Interpretation: Sinus rhythm Low voltage, precordial leads Baseline wander in lead(s) V2 Partial missing lead(s): V2 When compared with ECG of 06/12/2019, No significant change was found Confirmed by Dione Booze ([id number]) on 06/17/2021 3:46:18 AM ? ?Radiology ?CT ABDOMEN PELVIS W CONTRAST ? ?Result Date: 06/17/2021 ?CLINICAL DATA:  Right lower quadrant abdominal pain. Status post hysterectomy 1 week ago EXAM: CT ABDOMEN AND PELVIS WITH CONTRAST TECHNIQUE: Multidetector CT imaging of the abdomen and pelvis was performed using the standard protocol following bolus administration of intravenous contrast. RADIATION DOSE REDUCTION: This exam was performed  according to the departmental dose-optimization program which includes automated exposure control, adjustment of the mA and/or kV according to patient size and/or use of iterative reconstruction technique. CONTRAST:  OMNIPAQUE IOHEXOL 300 MG/ML  SOLN COMPARISON:  08/27/20 FINDINGS: Lower chest: No acute abnormality. Hepatobiliary: No focal liver abnormality is seen. No gallstones, gallbladder wall thickening, or biliary dilatation. Pancreas: Unremarkable. No pancreatic ductal dilatation or surrounding inflammatory changes. Spleen: Normal in size without focal abnormality. Adrenals/Urinary Tract: Normal adrenal glands. No kidney mass or hydronephrosis identified. No focal bladder abnormality identified. Stomach/Bowel: Stomach appears normal. Status post appendectomy. No small bowel wall thickening, inflammation, or distension. Sigmoid diverticulosis identified. No signs of acute diverticulitis. Vascular/Lymphatic: Mild aortic atherosclerosis. No aneurysm. No abdominal adenopathy. There are several prominent left external iliac lymph nodes identified which are likely reactive in the early postoperative time frame. This includes a 1.1 cm left pelvic sidewall lymph node, image  68/2. Left posterior pelvic sidewall lymph node measures 1.2 cm, image 71/2. Reproductive: Status post scratch set postoperative changes consistent with recent total abdominal hysterectomy and right salpingo-oophorectomy. Small, nonspecific fluid collection posterior to the vaginal cuff measures 2 x 1.4 cm, image 7/8. This is nonspecific in the early postoperative time frame. Other: Small volume of high attenuation soft tissue stranding within the pelvis likely represents residual postoperative hemoperitoneum. Within the left pelvic sidewall there is a high attenuation mass measuring 6.9 x 3.2 by 4.3 cm compatible with extraperitoneal hematoma. No drainable fluid collections identified at this time. Postoperative changes from ventral  abdominal wall herniorrhaphy with intact hernia mesh. Soft tissue stranding is identified within the subcutaneous soft tissues of the ventral abdominal wall, image 71/2. Musculoskeletal: No acute or suspicious osseous findings. L5-

## 2021-06-17 NOTE — Progress Notes (Signed)
Pharmacy Antibiotic Note ? ?Jane Murphy is a 66 y.o. female status post hysterectomy 3 days prior to admission presented to the ED on 06/17/2021 with CC of fever. Abdominal CT negative for abscess. CXR low lung volumes with mild atelectasis at the lung bases. Pharmacy is consulted for vancomycin and cefepime dosing for broad empiric coverage. ? ?Plan: ?- Vancomycin 2000mg  for 1 ordered this morning in the ED followed by 1250mg  q24h for estimated AUC of 515 ?- Cefepime 2g q8h ?- Metronidazole 500mg  q12h ?- Monitor renal function and clinical status ? ?Height: 5\' 7"  (170.2 cm) ?Weight: 90.7 kg (200 lb) ?IBW/kg (Calculated) : 61.6 ? ?Temp (24hrs), Avg:100.3 ?F (37.9 ?C), Min:99.8 ?F (37.7 ?C), Max:100.8 ?F (38.2 ?C) ? ?Recent Labs  ?Lab 06/17/21 ?0146 06/17/21 ?0404  ?WBC 12.6*  --   ?CREATININE 0.95  --   ?LATICACIDVEN  --  2.0*  ?  ?Estimated Creatinine Clearance: 70 mL/min (by C-G formula based on SCr of 0.95 mg/dL).   ? ?Allergies  ?Allergen Reactions  ? Aspirin Anaphylaxis  ? Nsaids Anaphylaxis  ? Other Anaphylaxis  ?  MSG  ? Food Hives and Other (See Comments)  ?  Citrus Fruit also causes mouth ulcers.  ? ? ?Microbiology results: ?- 314 Blood Culture:  ?- 3/14 Urine culture:  ?- No COVID or flu ? ? ?Jane Murphy ?06/17/2021 8:40 AM ? ?

## 2021-06-17 NOTE — Progress Notes (Signed)
A consult was received from an ED physician for Vancomycin & Cefepime per pharmacy dosing.  The patient's profile has been reviewed for ht/wt/allergies/indication/available labs.   ?A one time order has been placed for Cefepime 2gm & Vancomycin 2gm IV.  Further antibiotics/pharmacy consults should be ordered by admitting physician if indicated.       ?                ?Thank you, ?Junita Push PharmD ?06/17/2021  5:47 AM  ?

## 2021-06-17 NOTE — ED Notes (Addendum)
Attempt once in R AC unbale to collect labs  ?

## 2021-06-17 NOTE — Consult Note (Signed)
? ?OB/GYN Telephone Consult ? ?06/17/21 ?12:13 PM ? ?Jane Murphy is a 66 y.o. currently presenting for with fever following her hysterectomy of 101.9 at home.   ? ?I was called for a consult regarding the care of this patient by [facility].   ? ?The provider had a clinical question about management of potential infection and any additional considerations for the hematomas noted on CT here.   ? ?The provider presented the following relevant clinical information along with my independent chart review of available documentation in both this chart and her chart in [place]: ? ?She is POD7 s/p TLH, RSO, Lysis of adhesions, USLS, posterior repair, transobturator midurethral sling and cystoscopy for incontinence - urinary and fecal.  She had her surgery with [facility]. Her surgery was complicated postoperative with blood loss (intrabdominal) with hypotension for which she was given 3 units of blood. She had a CT which showed a collection in the obturator area of her pelvis which measured 9.1x3.8x2.7cm. Her imaging here showed a measurement of the same collection of 6.9x3.2x4.3cm. Once stability was assured following her procedure she was ultimately discharged home.  She then presented today for fever following her surgery.   ? ?Here she has received Cefepime and Vancomycin for antibiotic coverage. Her Tmax was on admission of 100.8, her Tc is 100.4 at 12pm today.  ? ?Her initial labs are Lactic acid of 2.0, INR 1.2, HgB 11.3, and WBC 12.6.  ? ?I performed a chart review on the patient and reviewed available documentation including her postoperative note, discharge summary from her surgery on 3/7. I also reviewed her imaging report from the outside facility (her prior CT) along with her CT report here. ? ?BP (!) 161/80 (BP Location: Right Arm)   Pulse 92   Temp (!) 100.4 ?F (38 ?C) (Oral)   Resp 16   Ht 5\' 7"  (1.702 m)   Wt 90.7 kg   SpO2 93%   BMI 31.32 kg/m?   ?Exam- performed by  consulting provider ? ? ?Recommendations:  ?- I agree with current antibiotic regimen. Many SSI following hysterectomy are polymicrobial so coverage for GNR and GPC ideal. Upon review of her chart, I think vaginal cuff cellulitis is unlikely and more likely is infection of the hematoma that was found following her surgery. It appears it is reducing in size which is reassuring. I am additionally reassured by her WBC of 12.6 (similar to her baseline preop) suggesting this is an early infection of the hematoma.  ?- I do not think active bleeding of this hematoma given reduced size plus her hemoglobin is rising from her discharge from the outside hospital (d/c hemoglobin was 10.2).  ?- I would not recommend drainage at this time - antibiotic therapy should be sufficient with anticipated response from IV would be typically afebrile within 48 hours but may take up to 72 hours.  ?- If she does not respond as anticipated, would consider consult to ID prior to consideration for drainage as there is not a confirmed abscess from imaging and drainage will likely have little utility (formed clot). ?- Lastly, for home regimen assuming response to ABX she could do either Augmentin BID for 14 days OR Flagyl 500 BID with Bactrim DS BID for 14 days ?  ?-Recommended follow up with her operating surgeon with 7 days.  ? ?Thank you for this consult and if additional recommendations are needed please call [phone] for the OB/GYN attending on service at [facility].  ? ?  I spent approximately 5 minutes directly consulting with the provider and verbally discussing this case. Additionally 20 minutes minutes was spent performing chart review and documentation.  ? ? ?Radene Gunning, MD ? ? ?

## 2021-06-17 NOTE — Sepsis Progress Note (Signed)
Following per sepsis protocol   

## 2021-06-17 NOTE — ED Notes (Signed)
Pt is AxO x4, ambulatory with cane, on room air.  ?

## 2021-06-18 DIAGNOSIS — T148XXA Other injury of unspecified body region, initial encounter: Secondary | ICD-10-CM | POA: Diagnosis present

## 2021-06-18 LAB — CBC
HCT: 31 % — ABNORMAL LOW (ref 36.0–46.0)
Hemoglobin: 9.7 g/dL — ABNORMAL LOW (ref 12.0–15.0)
MCH: 29.8 pg (ref 26.0–34.0)
MCHC: 31.3 g/dL (ref 30.0–36.0)
MCV: 95.1 fL (ref 80.0–100.0)
Platelets: 168 10*3/uL (ref 150–400)
RBC: 3.26 MIL/uL — ABNORMAL LOW (ref 3.87–5.11)
RDW: 13.2 % (ref 11.5–15.5)
WBC: 11 10*3/uL — ABNORMAL HIGH (ref 4.0–10.5)
nRBC: 0 % (ref 0.0–0.2)

## 2021-06-18 LAB — BASIC METABOLIC PANEL
Anion gap: 8 (ref 5–15)
BUN: 14 mg/dL (ref 8–23)
CO2: 27 mmol/L (ref 22–32)
Calcium: 8.8 mg/dL — ABNORMAL LOW (ref 8.9–10.3)
Chloride: 102 mmol/L (ref 98–111)
Creatinine, Ser: 0.76 mg/dL (ref 0.44–1.00)
GFR, Estimated: >60 mL/min (ref >60)
Glucose, Bld: 182 mg/dL — ABNORMAL HIGH (ref 70–99)
Potassium: 4 mmol/L (ref 3.5–5.1)
Sodium: 137 mmol/L (ref 135–145)

## 2021-06-18 LAB — GLUCOSE, CAPILLARY
Glucose-Capillary: 180 mg/dL — ABNORMAL HIGH (ref 70–99)
Glucose-Capillary: 146 mg/dL — ABNORMAL HIGH (ref 70–99)
Glucose-Capillary: 128 mg/dL — ABNORMAL HIGH (ref 70–99)
Glucose-Capillary: 133 mg/dL — ABNORMAL HIGH (ref 70–99)

## 2021-06-18 LAB — HIV ANTIBODY (ROUTINE TESTING W REFLEX): HIV Screen 4th Generation wRfx: NONREACTIVE

## 2021-06-18 NOTE — Assessment & Plan Note (Signed)
Large hematoma. Related to recent hysterectomy. Likely infected. Gynecology consulted and recommend conservative management and no aspiration of hematoma. ?

## 2021-06-18 NOTE — Assessment & Plan Note (Addendum)
-  Continue Lipitor °

## 2021-06-18 NOTE — Hospital Course (Signed)
Jane Murphy is a 66 y.o. female with a history of osteoarthritis, hypertension, hyperlipidemia, nephrolithiasis, PTSD, herpes zoster, obesity, recent hysterectomy. Patient presented secondary to fever in setting of recent hysterectomy with CT evidence of large pelvis hematoma and concern for associated infection. Sepsis criteria met on admission. Empiric antibiotics initiated. ?

## 2021-06-18 NOTE — Assessment & Plan Note (Signed)
Body mass index is 31.32 kg/m².

## 2021-06-18 NOTE — Assessment & Plan Note (Addendum)
Fever, leukocytosis and lactic acidosis. Blood and urine cultures obtained on admission. Urine culture with 40,000 colonies of proteus mirabilis. Asymptomatic. Blood cultures with no growth to date. ?

## 2021-06-18 NOTE — Assessment & Plan Note (Signed)
Noted  

## 2021-06-18 NOTE — Assessment & Plan Note (Addendum)
Presumed secondary to identified large hematoma. Hemoglobin stable. ?

## 2021-06-18 NOTE — Progress Notes (Signed)
? ?PROGRESS NOTE ? ? ? ?Jane Murphy  GUY:[id number] DOB: 1955/05/22 DOA: 06/17/2021 ?PCP: [facility] ? ? ?Brief Narrative: ?Jane Murphy is a 66 y.o. female with a history of osteoarthritis, hypertension, hyperlipidemia, nephrolithiasis, PTSD, herpes zoster, obesity, recent hysterectomy. Patient presented secondary to fever in setting of recent hysterectomy with CT evidence of large pelvis hematoma and concern for associated infection. Sepsis criteria met on admission. Empiric antibiotics initiated. ? ? ?Assessment and Plan: ?* Pelvic hematoma, female ?Large hematoma. Related to recent hysterectomy. Likely infected. Gynecology consulted and recommend conservative management and no aspiration of hematoma. ? ?Infected hematoma ?Likely diagnosis. Patient with sepsis criteria on admission. Gynecology consulted with recommendation for GNR and anaerobic coverage. Recommendation for no aspiration. No abscess noted on imaging. ?-Continue Cefepime/Flagyl ?-Discontinue Vancyomcin ? ?Severe sepsis ([place]) ?Fever, leukocytosis and lactic acidosis. Blood and urine cultures obtained on admission. Urine culture with 40,000 colonies of proteus mirabilis. Asymptomatic. ?-Follow-up blood cultures ? ?Aortic atherosclerosis ([place]) ?Noted. ? ?Acute blood loss anemia ?Presumed secondary to identified large hematoma. ?-CBC daily until stable ? ?Class 1 obesity ?Body mass index is 31.32 kg/m?. ? ?Hyperlipidemia ?-Continue Lipitor ? ? ? ?DVT prophylaxis: SCDs ?Code Status:   Code Status: Full Code ?Family Communication: None at bedside ?Disposition Plan: Discharge home likely in 24 hours pending culture data results and transition to oral antibiotics ? ? ?Consultants:  ?Gynecology ? ?Procedures:  ?None ? ?Antimicrobials: ?Vancomycin ?Cefepime ?Flagyl ?Valacyclovir  ? ? ?Subjective: ?Patient reports no issues overnight. Stats it is not a good morning because she is finding out that we require gowns to see her in the room  (contact precautions). She is also unhappy that the hospital will not allow her dog to be present in the hospital and states we are discriminating against her dog. No nausea or vomiting. No dysuria. Baseline incontinence. ? ?Objective: ?BP 133/72 (BP Location: Right Arm)   Pulse (!) 54   Temp 98.3 ?F (36.8 ?C) (Oral)   Resp 18   Ht _0  (1.702 m)   Wt 90.7 kg   SpO2 96%   BMI 31.32 kg/m?  ? ?Examination: ? ?General exam: Appears calm and comfortable ?Respiratory system: Clear to auscultation. Respiratory effort normal. ?Cardiovascular system: S1 & S2 heard, RRR. No murmurs, rubs, gallops or clicks. ?Gastrointestinal system: Abdomen is nondistended, soft and mildly tender in RLQ. Normal bowel sounds heard. ?Central nervous system: Alert and oriented. No focal neurological deficits. ?Musculoskeletal: No edema. No calf tenderness ?Skin: No cyanosis. No rashes ?Psychiatry: Judgement and insight appear normal. Mood & affect appropriate.  ? ? ?Data Reviewed: I have personally reviewed following labs and imaging studies ? ?CBC ?Lab Results  ?Component Value Date  ? WBC 11.0 (H) 06/18/2021  ? RBC 3.26 (L) 06/18/2021  ? HGB 9.7 (L) 06/18/2021  ? HCT 31.0 (L) 06/18/2021  ? MCV 95.1 06/18/2021  ? MCH 29.8 06/18/2021  ? PLT 168 06/18/2021  ? MCHC 31.3 06/18/2021  ? RDW 13.2 06/18/2021  ? LYMPHSABS 2.3 06/17/2021  ? MONOABS 1.0 06/17/2021  ? EOSABS 0.3 06/17/2021  ? BASOSABS 0.1 06/17/2021  ? ? ? ?Last metabolic panel ?Lab Results  ?Component Value Date  ? NA 137 06/18/2021  ? K 4.0 06/18/2021  ? CL 102 06/18/2021  ? CO2 27 06/18/2021  ? BUN 14 06/18/2021  ? CREATININE 0.76 06/18/2021  ? GLUCOSE 182 (H) 06/18/2021  ? GFRNONAA >60 06/18/2021  ? GFRAA >60 06/11/2019  ? CALCIUM 8.8 (L) 06/18/2021  ? PHOS 3.4 06/17/2021  ?  PROT 7.1 06/17/2021  ? ALBUMIN 4.0 06/17/2021  ? LABGLOB 2.6 04/22/2018  ? AGRATIO 1.7 04/22/2018  ? BILITOT 0.8 06/17/2021  ? ALKPHOS 62 06/17/2021  ? AST 20 06/17/2021  ? ALT 10 06/17/2021  ? ANIONGAP 8  06/18/2021  ? ? ?GFR: ?Estimated Creatinine Clearance: 83.2 mL/min (by C-G formula based on SCr of 0.76 mg/dL). ? ?Recent Results (from the past 240 hour(s))  ?Blood Culture (routine x 2)     Status: None (Preliminary result)  ? Collection Time: 06/17/21  3:02 AM  ? Specimen: BLOOD LEFT FOREARM  ?Result Value Ref Range Status  ? Specimen Description   Final  ?  BLOOD LEFT FOREARM ?Performed at [facility], [place] [address] [place], [place] [address] ?  ? Special Requests   Final  ?  BOTTLES DRAWN AEROBIC AND ANAEROBIC Blood Culture adequate volume ?Performed at [facility], [place] [address] [place], [place] [address] ?  ? Culture   Final  ?  NO GROWTH < 12 HOURS ?Performed at [facility], [place] [address] [place], [place] [address] ?  ? Report Status PENDING  Incomplete  ?Resp Panel by RT-PCR (Flu A&B, Covid)     Status: None  ? Collection Time: 06/17/21  3:07 AM  ? Specimen: Nasopharyngeal(NP) swabs in vial transport medium  ?Result Value Ref Range Status  ? SARS Coronavirus 2 by RT PCR NEGATIVE NEGATIVE Final  ?  Comment: (NOTE) ?SARS-CoV-2 target nucleic acids are NOT DETECTED. ? ?The SARS-CoV-2 RNA is generally detectable in upper respiratory ?specimens during the acute phase of infection. The lowest ?concentration of SARS-CoV-2 viral copies this assay can detect is ?138 copies/mL. A negative result does not preclude SARS-Cov-2 ?infection and should not be used as the sole basis for treatment or ?other patient management decisions. A negative result may occur with  ?improper specimen collection/handling, submission of specimen other ?than nasopharyngeal swab, presence of viral mutation(s) within the ?areas targeted by this assay, and inadequate number of viral ?copies(<138 copies/mL). A negative result must be combined with ?clinical observations, patient history, and epidemiological ?information. The expected result is Negative. ? ?Fact Sheet for Patients:   ?EntrepreneurPulse.com.au ? ?Fact Sheet for Healthcare Providers:  ?IncredibleEmployment.be ? ?This test is no t yet approved or cleared by the [place] FDA and  ?has been authorized for detection and/or diagnosis of SARS-CoV-2 by ?FDA under an Emergency Use Authorization (EUA). This EUA will remain  ?in effect (meaning this test can be used) for the duration of the ?COVID-19 declaration under Section 564(b)(1) of the Act, 21 ?U.S.C.section 360bbb-3(b)(1), unless the authorization is terminated  ?or revoked sooner.  ? ? ?  ? Influenza A by PCR NEGATIVE NEGATIVE Final  ? Influenza B by PCR NEGATIVE NEGATIVE Final  ?  Comment: (NOTE) ?The Xpert Xpress SARS-CoV-2/FLU/RSV plus assay is intended as an aid ?in the diagnosis of influenza from Nasopharyngeal swab specimens and ?should not be used as a sole basis for treatment. Nasal washings and ?aspirates are unacceptable for Xpert Xpress SARS-CoV-2/FLU/RSV ?testing. ? ?Fact Sheet for Patients: ?EntrepreneurPulse.com.au ? ?Fact Sheet for Healthcare Providers: ?IncredibleEmployment.be ? ?This test is not yet approved or cleared by the [place] FDA and ?has been authorized for detection and/or diagnosis of SARS-CoV-2 by ?FDA under an Emergency Use Authorization (EUA). This EUA will remain ?in effect (meaning this test can be used) for the duration of the ?COVID-19 declaration under Section 564(b)(1) of the Act, 21 U.S.C. ?section 360bbb-3(b)(1), unless  the authorization is terminated or ?revoked. ? ?Performed at [facility], [place] [address] ?[place], [place] [address] ?  ?Urine Culture     Status: Abnormal (Preliminary result)  ? Collection Time: 06/17/21  3:19 AM  ? Specimen: In/Out Cath Urine  ?Result Value Ref Range Status  ? Specimen Description   Final  ?  IN/OUT CATH URINE ?Performed at [facility], [place] [address] [place], [place] [address] ?  ?  Special Requests   Final  ?  NONE ?Performed at [facility], [place] [address], [place], [place] [address] ?  ? Culture (A)  Final  ?  40,000 COLONIES/mL GRAM NEGATIVE RODS ?SUSCEPTIBILITIES TO FOLLOW ?Per

## 2021-06-18 NOTE — Assessment & Plan Note (Addendum)
Likely diagnosis. Patient with sepsis criteria on admission. Gynecology consulted with recommendation for GNR and anaerobic coverage. Recommendation for no aspiration. No abscess noted on imaging. Patient treated empirically with Vancomycin, Cefepime and Flagyl. Vancomycin discontinued initially with eventual transition from Cefepime/Flagyl to Augmentin. Discharged to complete a 14 day course of antibiotics. Patient to follow-up with outpatient gynecologist. ?

## 2021-06-19 LAB — HEMOGLOBIN A1C
Hgb A1c MFr Bld: 6.2 % — ABNORMAL HIGH (ref 4.8–5.6)
Mean Plasma Glucose: 131 mg/dL

## 2021-06-19 LAB — CBC
HCT: 32 % — ABNORMAL LOW (ref 36.0–46.0)
Hemoglobin: 10.3 g/dL — ABNORMAL LOW (ref 12.0–15.0)
MCH: 30.1 pg (ref 26.0–34.0)
MCHC: 32.2 g/dL (ref 30.0–36.0)
MCV: 93.6 fL (ref 80.0–100.0)
Platelets: 204 10*3/uL (ref 150–400)
RBC: 3.42 MIL/uL — ABNORMAL LOW (ref 3.87–5.11)
RDW: 13.1 % (ref 11.5–15.5)
WBC: 9.5 10*3/uL (ref 4.0–10.5)
nRBC: 0 % (ref 0.0–0.2)

## 2021-06-19 LAB — GLUCOSE, CAPILLARY
Glucose-Capillary: 158 mg/dL — ABNORMAL HIGH (ref 70–99)
Glucose-Capillary: 119 mg/dL — ABNORMAL HIGH (ref 70–99)

## 2021-06-19 MED ORDER — AMOXICILLIN-POT CLAVULANATE 875-125 MG PO TABS
1.0000 | ORAL_TABLET | Freq: Two times a day (BID) | ORAL | Status: DC
  Administered 2021-06-19: 1 via ORAL
  Filled 2021-06-19: qty 1

## 2021-06-19 MED ORDER — AMOXICILLIN-POT CLAVULANATE 875-125 MG PO TABS: 1.0000 | ORAL_TABLET | Freq: Two times a day (BID) | ORAL | 0 refills | Status: AC

## 2021-06-19 MED ORDER — LIP MEDEX EX OINT
1.0000 "application " | TOPICAL_OINTMENT | CUTANEOUS | Status: DC | PRN
  Administered 2021-06-19: 1 via TOPICAL
  Filled 2021-06-19: qty 7

## 2021-06-19 MED ORDER — OXYCODONE HCL 10 MG PO TABS: 10.0000 mg | ORAL_TABLET | Freq: Four times a day (QID) | ORAL | 0 refills | Status: DC | PRN

## 2021-06-19 NOTE — Discharge Summary (Signed)
?Physician Discharge Summary ?  ?Patient: Jane Murphy MRN: [id number] DOB: 01-03-1956  ?Admit date:     06/17/2021  ?Discharge date: 06/19/21  ?Discharge Physician: Cordelia Poche  ? ?PCP: [facility]  ? ?Recommendations at discharge:  ? ?PCP and gynecology follow-up ?Repeat CBC in 5-7 days ? ?Discharge Diagnoses: ?Principal Problem: ?  Pelvic hematoma, female ?Active Problems: ?  Severe sepsis ([place]) ?  Infected hematoma ?  Hyperlipidemia ?  Type 2 diabetes mellitus with hyperglycemia (HCC) ?  Cardiomegaly ?  Class 1 obesity ?  Acute blood loss anemia ?  Aortic atherosclerosis ([place]) ? ?Resolved Problems: ?  * No resolved hospital problems. * ? ?Hospital Course: ?Akili Cuda is a 66 y.o. female with a history of osteoarthritis, hypertension, hyperlipidemia, nephrolithiasis, PTSD, herpes zoster, obesity, recent hysterectomy. Patient presented secondary to fever in setting of recent hysterectomy with CT evidence of large pelvis hematoma and concern for associated infection. Sepsis criteria met on admission. Empiric antibiotics initiated. ? ?Assessment and Plan: ?* Pelvic hematoma, female ?Large hematoma. Related to recent hysterectomy. Likely infected. Gynecology consulted and recommend conservative management and no aspiration of hematoma. ? ?Infected hematoma ?Likely diagnosis. Patient with sepsis criteria on admission. Gynecology consulted with recommendation for GNR and anaerobic coverage. Recommendation for no aspiration. No abscess noted on imaging. Patient treated empirically with Vancomycin, Cefepime and Flagyl. Vancomycin discontinued initially with eventual transition from Cefepime/Flagyl to Augmentin. Discharged to complete a 14 day course of antibiotics. Patient to follow-up with outpatient gynecologist. ? ?Severe sepsis ([place]) ?Fever, leukocytosis and lactic acidosis. Blood and urine cultures obtained on admission. Urine culture with 40,000 colonies of proteus mirabilis. Asymptomatic.  Blood cultures with no growth to date. ? ?Acute blood loss anemia ?Presumed secondary to identified large hematoma. Hemoglobin stable. ? ?Asymptomatic bacteriuria ?Proteus mirabilis on urine culture. 40,000 colonies noted. No treatment given. ? ?Aortic atherosclerosis ([place]) ?Noted. ? ?Class 1 obesity ?Body mass index is 31.32 kg/m?. ? ?Hyperlipidemia ?Continue Lipitor ? ?Type 2 diabetes mellitus with hyperglycemia (HCC) ?Continue home alogliptan. ? ? ? ? ?  ? ? ?Consultants: Gynecology ?Procedures performed: None  ?Disposition: Home ?Diet recommendation:  ?Carb modified diet ? ?DISCHARGE MEDICATION: ?Allergies as of 06/19/2021   ? ?   Reactions  ? Aspirin Anaphylaxis  ? Nsaids Anaphylaxis  ? Other Anaphylaxis  ? MSG  ? Metformin And Related   ? Diarrhea  ? Oxybutynin Other (See Comments)  ? Severe dry eyes  ? Valproic Acid And Related Other (See Comments)  ? Sedation  ? Food Hives, Other (See Comments)  ? Citrus Fruit also causes mouth ulcers.  ? ?  ? ?  ?Medication List  ?  ? ?TAKE these medications   ? ?acetaminophen 500 MG tablet ?Commonly known as: TYLENOL ?Take 1 tablet (500 mg total) by mouth 2 (two) times daily. ?What changed:  ?how much to take ?when to take this ?reasons to take this ?  ?Alogliptin Benzoate 12.5 MG Tabs ?Take 12.5 mg by mouth daily. ?  ?amoxicillin-clavulanate 875-125 MG tablet ?Commonly known as: AUGMENTIN ?Take 1 tablet by mouth every 12 (twelve) hours for 12 days. ?  ?atorvastatin 40 MG tablet ?Commonly known as: LIPITOR ?Take 40 mg by mouth daily. ?  ?docusate sodium 100 MG capsule ?Commonly known as: COLACE ?Take 100 mg by mouth daily as needed for mild constipation. ?  ?fluticasone 50 MCG/ACT nasal spray ?Commonly known as: FLONASE ?Place 1 spray into both nostrils daily as needed for allergies or rhinitis. ?What changed: when to  take this ?  ?gabapentin 400 MG capsule ?Commonly known as: NEURONTIN ?Take 800 mg by mouth in the morning, at noon, in the evening, and at bedtime. ?   ?ondansetron 4 MG disintegrating tablet ?Commonly known as: Zofran ODT ?Take 1 tablet (4 mg total) by mouth every 8 (eight) hours as needed for nausea or vomiting. ?  ?Oxycodone HCl 10 MG Tabs ?Take 1 tablet (10 mg total) by mouth every 6 (six) hours as needed. ?What changed: reasons to take this ?  ?polyethylene glycol 17 g packet ?Commonly known as: MIRALAX / GLYCOLAX ?Take 17 g by mouth daily as needed for mild constipation. ?  ?sodium chloride 0.65 % Soln nasal spray ?Commonly known as: OCEAN ?Place 1 spray into both nostrils daily. ?  ?tiZANidine 4 MG tablet ?Commonly known as: ZANAFLEX ?Take 4-8 mg by mouth every 6 (six) hours as needed for muscle spasms. ?  ?valACYclovir 500 MG tablet ?Commonly known as: VALTREX ?Take 500 mg by mouth daily. ?  ?VITAMIN B 12 PO ?Take 1 tablet by mouth daily. ?  ?VITAMIN D PO ?Take 1 tablet by mouth daily. ?  ? ?  ? ? Follow-up Information   ? ? [facility] Schedule an appointment as soon as possible for a visit.   ?Why: For hospital follow-up ?Contact information: ?[address] ?[place] [address] ?[phone] ? ? ?  ?  ? ? Kerney Elbe, MD. Schedule an appointment as soon as possible for a visit in 1 week(s).   ?Specialty: Obstetrics and Gynecology ?Why: For hospital follow-up ?Contact information: ?[address] ?[place] [address] ?[phone] ? ? ?  ?  ? ?  ?  ? ?  ? ?Discharge Exam: ?Danley Danker Weights  ? 06/17/21 0034  ?Weight: 90.7 kg  ? ?General exam: Appears calm and comfortable ?Respiratory system: Clear to auscultation. Respiratory effort normal. ?Cardiovascular system: S1 & S2 heard, RRR. No murmurs, rubs, gallops or clicks. ?Gastrointestinal system: Abdomen is nondistended, soft and nontender. No organomegaly or masses felt. Normal bowel sounds heard. ?Central nervous system: Alert and oriented. No focal neurological deficits. ?Musculoskeletal: No edema. No calf tenderness ?Skin: No cyanosis. No rashes ?Psychiatry:  Judgement and insight appear normal. Mood & affect appropriate.  ? ?Condition at discharge: stable ? ?The results of significant diagnostics from this hospitalization (including imaging, microbiology, ancillary and laboratory) are listed below for reference.  ? ?Imaging Studies: ?CT ABDOMEN PELVIS W CONTRAST ? ?Result Date: 06/17/2021 ?CLINICAL DATA:  Right lower quadrant abdominal pain. Status post hysterectomy 1 week ago EXAM: CT ABDOMEN AND PELVIS WITH CONTRAST TECHNIQUE: Multidetector CT imaging of the abdomen and pelvis was performed using the standard protocol following bolus administration of intravenous contrast. RADIATION DOSE REDUCTION: This exam was performed according to the departmental dose-optimization program which includes automated exposure control, adjustment of the mA and/or kV according to patient size and/or use of iterative reconstruction technique. CONTRAST:  130mL OMNIPAQUE IOHEXOL 300 MG/ML  SOLN COMPARISON:  08/27/20 FINDINGS: Lower chest: No acute abnormality. Hepatobiliary: No focal liver abnormality is seen. No gallstones, gallbladder wall thickening, or biliary dilatation. Pancreas: Unremarkable. No pancreatic ductal dilatation or surrounding inflammatory changes. Spleen: Normal in size without focal abnormality. Adrenals/Urinary Tract: Normal adrenal glands. No kidney mass or hydronephrosis identified. No focal bladder abnormality identified. Stomach/Bowel: Stomach appears normal. Status post appendectomy. No small bowel wall thickening, inflammation, or distension. Sigmoid diverticulosis identified. No signs of acute diverticulitis. Vascular/Lymphatic: Mild aortic atherosclerosis. No aneurysm. No abdominal adenopathy. There are several  prominent left external iliac lymph nodes identified which are likely reactive in the early postoperative time frame. This includes a 1.1 cm left pelvic sidewall lymph node, image 68/2. Left posterior pelvic sidewall lymph node measures 1.2 cm, image  71/2. Reproductive: Status post scratch set postoperative changes consistent with recent total abdominal hysterectomy and right salpingo-oophorectomy. Small, nonspecific fluid collection posterior to the vaginal cu

## 2021-06-19 NOTE — Discharge Instructions (Addendum)
Jane Murphy, ? ?You were in the hospital with evidence of an infection. This is thought to likely be from a hematoma from your recent surgery. You have been started on antibiotics. Please continue your total course (14 days of total antibiotics). Please follow-up with your PCP and Gynecologist. ?

## 2021-06-19 NOTE — Assessment & Plan Note (Signed)
Proteus mirabilis on urine culture. 40,000 colonies noted. No treatment given. ?

## 2021-06-19 NOTE — Assessment & Plan Note (Signed)
Continue home alogliptan. ?

## 2021-06-19 NOTE — Progress Notes (Signed)
Patient c/o of itching after IV antibiotics (cefepime) was given. PO benadryl given. SEE MAR.  ?

## 2021-06-20 LAB — URINE CULTURE: Culture: 40000 — AB

## 2021-06-22 LAB — CULTURE, BLOOD (ROUTINE X 2)
Culture: NO GROWTH
Culture: NO GROWTH
Special Requests: ADEQUATE

## 2022-01-26 ENCOUNTER — Encounter: Payer: Self-pay | Admitting: *Deleted

## 2022-04-27 ENCOUNTER — Ambulatory Visit
Admission: EM | Admit: 2022-04-27 | Discharge: 2022-04-27 | Disposition: A | Discharge status: Home / Self Care | Payer: No Typology Code available for payment source | Attending: Internal Medicine | Admitting: Internal Medicine

## 2022-04-27 DIAGNOSIS — B028 Zoster with other complications: Secondary | ICD-10-CM | POA: Diagnosis not present

## 2022-04-27 DIAGNOSIS — K047 Periapical abscess without sinus: Secondary | ICD-10-CM

## 2022-04-27 MED ORDER — OFLOXACIN 0.3 % OP SOLN: 1.0000 [drp] | Freq: Four times a day (QID) | OPHTHALMIC | 0 refills | Status: DC

## 2022-04-27 MED ORDER — VALACYCLOVIR HCL 1 G PO TABS: 1000.0000 mg | ORAL_TABLET | Freq: Three times a day (TID) | ORAL | 0 refills | Status: AC

## 2022-04-27 MED ORDER — HYDROCODONE-ACETAMINOPHEN 5-325 MG PO TABS: 1.0000 | ORAL_TABLET | Freq: Four times a day (QID) | ORAL | 0 refills | Status: DC | PRN

## 2022-04-27 MED ORDER — AMOXICILLIN-POT CLAVULANATE 875-125 MG PO TABS: 1.0000 | ORAL_TABLET | Freq: Two times a day (BID) | ORAL | 0 refills | Status: DC

## 2022-04-27 NOTE — Discharge Instructions (Addendum)
You have shingles which is being treated with Valtrex.  I have increased your dose for the next 10 days.  I have also prescribed antibiotic eyedrops that were recommended by the eye doctor.  They want to see you tomorrow for further evaluation and management.  I have prescribed antibiotic for dental pain.  Follow-up with dentist for further evaluation and management of this.  I have prescribed you Norco for pain. please use this sparingly.  Please do not drive or drink alcohol with taking it.

## 2022-04-27 NOTE — ED Triage Notes (Signed)
Pt c/o tooth abscess that was tx w/ abx prior to tooth removal planned for 1/9 but was unable 2/2 weather now abscess has returned.   Concerned for shingles across left forehead onset ~ today.

## 2022-04-27 NOTE — ED Provider Notes (Addendum)
[facility]    CSN: [id number] Arrival date & time: 04/27/22  1740      History   Chief Complaint Chief Complaint  Patient presents with   Abscess    HPI Jane Murphy is a 68 y.o. female.   Patient presents with 2 different chief complaints today.  Patient reports shingles to the left side of the face overlying the eye that has been present for a few days.  Patient reports that she has had shingles 27 times in her life and takes Valtrex 500 mg daily preventatively.  Reports that she has also had some blurry vision.  Reports that the rash is overlying the left eyebrow but she also has a lesion to her left nose and left cheek of face.  She has associated pain to the left side of her face as well.  Patient also reporting some dental pain that started about 3 days ago.  She reports that she saw dentist and has tooth extraction scheduled for 2/2.  He has not been on any antibiotics.  Patient reports that she has dental issues since 47.  Has right lower back dental pain and right upper back dental pain.   Abscess   Past Medical History:  Diagnosis Date   Arthritis    Hypertension    Kidney stone    Osteoarthritis    PTSD (post-traumatic stress disorder)    Rectocele    Shingles    Trigger finger of right thumb     Patient Active Problem List   Diagnosis Date Noted   Asymptomatic bacteriuria 06/19/2021   Infected hematoma 06/18/2021   Pelvic hematoma, female 06/17/2021   Type 2 diabetes mellitus with hyperglycemia (HCC) 06/17/2021   Cardiomegaly 06/17/2021   Class 1 obesity 06/17/2021   Acute blood loss anemia 06/17/2021   Aortic atherosclerosis (HCC) 06/17/2021   Severe sepsis (HCC) 06/17/2021   Epigastric pain    Tachycardia    Intractable nausea and vomiting 06/08/2019   Trigger finger of right thumb 04/22/2018   Allergies 04/22/2018   Chronic osteoarthritis 04/22/2018   Class 2 severe obesity due to excess calories with serious comorbidity and  body mass index (BMI) of 37.0 to 37.9 in adult [facility]) 04/22/2018   Hyperlipidemia 04/22/2018   Essential hypertension 10/05/2014    Past Surgical History:  Procedure Laterality Date   APPENDECTOMY     C section     CHOLECYSTECTOMY     HERNIA REPAIR     KNEE SURGERY     OOPHORECTOMY     SPINAL FUSION     TONSILLECTOMY      OB History   No obstetric history on file.      Home Medications    Prior to Admission medications   Medication Sig Start Date End Date Taking? Authorizing Provider  amoxicillin-clavulanate (AUGMENTIN) 875-125 MG tablet Take 1 tablet by mouth every 12 (twelve) hours. 04/27/22  Yes Venesha Petraitis, Rolly Salter E, FNP  HYDROcodone-acetaminophen (NORCO/VICODIN) 5-325 MG tablet Take 1 tablet by mouth every 6 (six) hours as needed for severe pain. 04/27/22  Yes Mykel Mohl, Rolly Salter E, FNP  ofloxacin (OCUFLOX) 0.3 % ophthalmic solution Place 1 drop into the left eye 4 (four) times daily. 04/27/22  Yes Tymere Depuy, Acie Fredrickson, FNP  valACYclovir (VALTREX) 1000 MG tablet Take 1 tablet (1,000 mg total) by mouth 3 (three) times daily for 10 days. 04/27/22 05/07/22 Yes Kerisha Goughnour, Acie Fredrickson, FNP  acetaminophen (TYLENOL) 500 MG tablet Take 1 tablet (500 mg total) by mouth 2 (two)  times daily. Patient taking differently: Take 1,000 mg by mouth every 6 (six) hours as needed for mild pain. 04/22/18   Azzie Glatter, FNP  Alogliptin Benzoate 12.5 MG TABS Take 12.5 mg by mouth daily.    [provider]  atorvastatin (LIPITOR) 40 MG tablet Take 40 mg by mouth daily.    [provider]  Cyanocobalamin (VITAMIN B 12 PO) Take 1 tablet by mouth daily.    [provider]  docusate sodium (COLACE) 100 MG capsule Take 100 mg by mouth daily as needed for mild constipation.    [provider]  fluticasone (FLONASE) 50 MCG/ACT nasal spray Place 1 spray into both nostrils daily as needed for allergies or rhinitis. Patient taking differently: Place 1 spray into both nostrils in the morning and at  bedtime. 04/22/18   Azzie Glatter, FNP  gabapentin (NEURONTIN) 400 MG capsule Take 800 mg by mouth in the morning, at noon, in the evening, and at bedtime. 06/11/21   [provider]  ondansetron (ZOFRAN ODT) 4 MG disintegrating tablet Take 1 tablet (4 mg total) by mouth every 8 (eight) hours as needed for nausea or vomiting. 08/27/20   Robinson, [place] N, PA-C  polyethylene glycol (MIRALAX / GLYCOLAX) 17 g packet Take 17 g by mouth daily as needed for mild constipation.    [provider]  sodium chloride (OCEAN) 0.65 % SOLN nasal spray Place 1 spray into both nostrils daily.    [provider]  tiZANidine (ZANAFLEX) 4 MG tablet Take 4-8 mg by mouth every 6 (six) hours as needed for muscle spasms. 05/20/21   [provider]  valACYclovir (VALTREX) 500 MG tablet Take 500 mg by mouth daily.    [provider]  VITAMIN D PO Take 1 tablet by mouth daily.    [provider]    Family History History reviewed. No pertinent family history.  Social History Social History   Tobacco Use   Smoking status: Former   Smokeless tobacco: Never  Substance Use Topics   Alcohol use: Yes    Alcohol/week: 4.0 standard drinks of alcohol    Types: 4 Cans of beer per week    Comment: Occasional    Drug use: No     Allergies   Aspirin, Nsaids, Other, Metformin and related, Oxybutynin, Valproic acid and related, and Food   Review of Systems Review of Systems Per HPI  Physical Exam Triage Vital Signs ED Triage Vitals  Enc Vitals Group     BP 04/27/22 1936 111/73     Pulse Rate 04/27/22 1936 75     Resp 04/27/22 1936 16     Temp 04/27/22 1936 98.4 F (36.9 C)     Temp Source 04/27/22 1936 Oral     SpO2 04/27/22 1936 95 %     Weight --      Height --      Head Circumference --      Peak Flow --      Pain Score 04/27/22 1937 7     Pain Loc --      Pain Edu? --      Excl. in [place]? --    No data found.  Updated Vital Signs BP 111/73 (BP  Location: Left Arm)   Pulse 75   Temp 98.4 F (36.9 C) (Oral)   Resp 16   SpO2 95%   Visual Acuity Right Eye Distance:   Left Eye Distance:   Bilateral Distance:  Right Eye Near:   Left Eye Near:    Bilateral Near:     Physical Exam Constitutional:      General: She is not in acute distress.    Appearance: Normal appearance. She is not toxic-appearing or diaphoretic.  HENT:     Head: Normocephalic and atraumatic.      Comments: Broken tooth to right upper back dentition and right lower back dentition.  Surrounding erythema and swelling to gingiva. Eyes:     General: Lids are normal. Lids are everted, no foreign bodies appreciated. Vision grossly intact. Gaze aligned appropriately.     Extraocular Movements: Extraocular movements intact.     Conjunctiva/sclera: Conjunctivae normal.     Pupils: Pupils are equal, round, and reactive to light.     Left eye: No corneal abrasion or fluorescein uptake.     Comments: No fluorescein reuptake noted.  Pulmonary:     Effort: Pulmonary effort is normal.  Skin:    Comments: Patient has erythematous, singular lesions present to left eyebrow, left cheek of face, left tip of nose.  No drainage noted.  Neurological:     General: No focal deficit present.     Mental Status: She is alert and oriented to person, place, and time. Mental status is at baseline.  Psychiatric:        Mood and Affect: Mood normal.        Behavior: Behavior normal.        Thought Content: Thought content normal.        Judgment: Judgment normal.      UC Treatments / Results  Labs (all labs ordered are listed, but only abnormal results are displayed) Labs Reviewed - No data to display  EKG   Radiology No results found.  Procedures Procedures (including critical care time)  Medications Ordered in UC Medications - No data to display  Initial Impression / Assessment and Plan / UC Course  I have reviewed the triage vital signs and the nursing  notes.  Pertinent labs & imaging results that were available during my care of the patient were reviewed by me and considered in my medical decision making (see chart for details).     Patient has a dental infection.  Will treat with Augmentin.  Patient has scheduled follow-up with dentist on 2/2 and encouraged patient to keep the scheduled appointment for further evaluation and management.  Patient has herpes zoster like rash present to left side of face with associated discomfort.  Fluorescein stain completed with no reuptake or notable dendritic lesions.  Called Dr. Hollice Espy with on-call ophthalmology.  He advised Valtrex 1000 mg 3 times daily and ofloxacin 4 times daily.  He also wanted to see her in office tomorrow for further evaluation and management.  Therefore, patient was advised of this and provided with contact information and address for [facility].  Patient requesting pain medication.  She is already taking gabapentin and Zanaflex as needed.  States that she has taken narcotic pain medications along with both of these medication before and it does not cause increased drowsiness or adverse side effects.  Given patient has 2 different etiologies that can cause significant pain, I do think that narcotic pain medication is reasonable given that she has taken these medications combined before and tolerated well.  Will prescribe Norco.  Advised patient this includes Tylenol.  She was advised to use it sparingly and to not drive or drink alcohol with taking it.  Patient was also advised  that Zanaflex and Valtrex together can have adverse effects and she states that she is already aware of this and will use it sparingly.  PDMP reviewed.  Discussed return precautions.  Patient verbalized understanding and was agreeable with plan. Final Clinical Impressions(s) / UC Diagnoses   Final diagnoses:  Herpes zoster with other complication  Dental infection     Discharge Instructions       You have shingles which is being treated with Valtrex.  I have increased your dose for the next 10 days.  I have also prescribed antibiotic eyedrops that were recommended by the eye doctor.  They want to see you tomorrow for further evaluation and management.  I have prescribed antibiotic for dental pain.  Follow-up with dentist for further evaluation and management of this.  I have prescribed you Norco for pain. please use this sparingly.  Please do not drive or drink alcohol with taking it.    ED Prescriptions     Medication Sig Dispense Auth. Provider   amoxicillin-clavulanate (AUGMENTIN) 875-125 MG tablet Take 1 tablet by mouth every 12 (twelve) hours. 14 tablet [place], [place] E, [place]   ofloxacin (OCUFLOX) 0.3 % ophthalmic solution Place 1 drop into the left eye 4 (four) times daily. 5 mL Ervin Knack E, [place]   valACYclovir (VALTREX) 1000 MG tablet Take 1 tablet (1,000 mg total) by mouth 3 (three) times daily for 10 days. 30 tablet [place], [place] E, [place]   HYDROcodone-acetaminophen (NORCO/VICODIN) 5-325 MG tablet Take 1 tablet by mouth every 6 (six) hours as needed for severe pain. 10 tablet [place], Acie Fredrickson, [place]      I have reviewed the PDMP during this encounter.   Gustavus Bryant, [place] 04/28/22 0810    Gustavus Bryant, [place] 04/28/22 [phone]

## 2022-08-14 ENCOUNTER — Other Ambulatory Visit: Payer: Self-pay

## 2022-08-14 ENCOUNTER — Emergency Department (HOSPITAL_COMMUNITY): Payer: No Typology Code available for payment source

## 2022-08-14 ENCOUNTER — Inpatient Hospital Stay (HOSPITAL_COMMUNITY)
Admission: EM | Admit: 2022-08-14 | Discharge: 2022-08-16 | DRG: 690 | Disposition: A | Discharge status: Home / Self Care | Payer: No Typology Code available for payment source | Attending: Internal Medicine | Admitting: Internal Medicine

## 2022-08-14 ENCOUNTER — Encounter (HOSPITAL_COMMUNITY): Payer: Self-pay

## 2022-08-14 DIAGNOSIS — N10 Acute tubulo-interstitial nephritis: Principal | ICD-10-CM | POA: Diagnosis present

## 2022-08-14 DIAGNOSIS — Z87891 Personal history of nicotine dependence: Secondary | ICD-10-CM | POA: Diagnosis not present

## 2022-08-14 DIAGNOSIS — R32 Unspecified urinary incontinence: Secondary | ICD-10-CM | POA: Diagnosis present

## 2022-08-14 DIAGNOSIS — E785 Hyperlipidemia, unspecified: Secondary | ICD-10-CM | POA: Diagnosis present

## 2022-08-14 DIAGNOSIS — R1115 Cyclical vomiting syndrome unrelated to migraine: Secondary | ICD-10-CM | POA: Diagnosis present

## 2022-08-14 DIAGNOSIS — Z9049 Acquired absence of other specified parts of digestive tract: Secondary | ICD-10-CM | POA: Diagnosis not present

## 2022-08-14 DIAGNOSIS — Z79899 Other long term (current) drug therapy: Secondary | ICD-10-CM

## 2022-08-14 DIAGNOSIS — N12 Tubulo-interstitial nephritis, not specified as acute or chronic: Secondary | ICD-10-CM | POA: Diagnosis present

## 2022-08-14 DIAGNOSIS — E876 Hypokalemia: Secondary | ICD-10-CM | POA: Diagnosis present

## 2022-08-14 DIAGNOSIS — E1165 Type 2 diabetes mellitus with hyperglycemia: Secondary | ICD-10-CM | POA: Diagnosis present

## 2022-08-14 DIAGNOSIS — M1990 Unspecified osteoarthritis, unspecified site: Secondary | ICD-10-CM | POA: Diagnosis present

## 2022-08-14 DIAGNOSIS — Z981 Arthrodesis status: Secondary | ICD-10-CM

## 2022-08-14 DIAGNOSIS — R159 Full incontinence of feces: Secondary | ICD-10-CM | POA: Diagnosis present

## 2022-08-14 DIAGNOSIS — B9620 Unspecified Escherichia coli [E. coli] as the cause of diseases classified elsewhere: Secondary | ICD-10-CM | POA: Diagnosis present

## 2022-08-14 DIAGNOSIS — Z87442 Personal history of urinary calculi: Secondary | ICD-10-CM | POA: Diagnosis not present

## 2022-08-14 DIAGNOSIS — I10 Essential (primary) hypertension: Secondary | ICD-10-CM | POA: Diagnosis present

## 2022-08-14 DIAGNOSIS — F4310 Post-traumatic stress disorder, unspecified: Secondary | ICD-10-CM | POA: Diagnosis present

## 2022-08-14 DIAGNOSIS — G894 Chronic pain syndrome: Secondary | ICD-10-CM | POA: Diagnosis present

## 2022-08-14 DIAGNOSIS — Z886 Allergy status to analgesic agent status: Secondary | ICD-10-CM

## 2022-08-14 DIAGNOSIS — R197 Diarrhea, unspecified: Secondary | ICD-10-CM | POA: Diagnosis present

## 2022-08-14 DIAGNOSIS — Z888 Allergy status to other drugs, medicaments and biological substances status: Secondary | ICD-10-CM

## 2022-08-14 LAB — CBC WITH DIFFERENTIAL/PLATELET
Abs Immature Granulocytes: 0.31 10*3/uL — ABNORMAL HIGH (ref 0.00–0.07)
Basophils Absolute: 0.1 10*3/uL (ref 0.0–0.1)
Basophils Relative: 1 %
Eosinophils Absolute: 0 10*3/uL (ref 0.0–0.5)
Eosinophils Relative: 0 %
HCT: 40.1 % (ref 36.0–46.0)
Hemoglobin: 13.9 g/dL (ref 12.0–15.0)
Immature Granulocytes: 2 %
Lymphocytes Relative: 14 %
Lymphs Abs: 2.4 10*3/uL (ref 0.7–4.0)
MCH: 31.4 pg (ref 26.0–34.0)
MCHC: 34.7 g/dL (ref 30.0–36.0)
MCV: 90.5 fL (ref 80.0–100.0)
Monocytes Absolute: 0.7 10*3/uL (ref 0.1–1.0)
Monocytes Relative: 4 %
Neutro Abs: 13.6 10*3/uL — ABNORMAL HIGH (ref 1.7–7.7)
Neutrophils Relative %: 79 %
Platelets: 475 10*3/uL — ABNORMAL HIGH (ref 150–400)
RBC: 4.43 MIL/uL (ref 3.87–5.11)
RDW: 12 % (ref 11.5–15.5)
WBC: 17 10*3/uL — ABNORMAL HIGH (ref 4.0–10.5)
nRBC: 0 % (ref 0.0–0.2)

## 2022-08-14 LAB — BLOOD GAS, VENOUS
Acid-Base Excess: 2.9 mmol/L — ABNORMAL HIGH (ref 0.0–2.0)
Bicarbonate: 25.7 mmol/L (ref 20.0–28.0)
O2 Saturation: 90.2 %
Patient temperature: 37
pCO2, Ven: 33 mmHg — ABNORMAL LOW (ref 44–60)
pH, Ven: 7.5 — ABNORMAL HIGH (ref 7.25–7.43)
pO2, Ven: 55 mmHg — ABNORMAL HIGH (ref 32–45)

## 2022-08-14 LAB — CBC
HCT: 38.6 % (ref 36.0–46.0)
Hemoglobin: 12.5 g/dL (ref 12.0–15.0)
MCH: 30.6 pg (ref 26.0–34.0)
MCHC: 32.4 g/dL (ref 30.0–36.0)
MCV: 94.4 fL (ref 80.0–100.0)
Platelets: 441 10*3/uL — ABNORMAL HIGH (ref 150–400)
RBC: 4.09 MIL/uL (ref 3.87–5.11)
RDW: 12.2 % (ref 11.5–15.5)
WBC: 18.3 10*3/uL — ABNORMAL HIGH (ref 4.0–10.5)
nRBC: 0 % (ref 0.0–0.2)

## 2022-08-14 LAB — COMPREHENSIVE METABOLIC PANEL
ALT: 37 U/L (ref 0–44)
AST: 25 U/L (ref 15–41)
Albumin: 3.9 g/dL (ref 3.5–5.0)
Alkaline Phosphatase: 77 U/L (ref 38–126)
Anion gap: 18 — ABNORMAL HIGH (ref 5–15)
BUN: 15 mg/dL (ref 8–23)
CO2: 21 mmol/L — ABNORMAL LOW (ref 22–32)
Calcium: 9.5 mg/dL (ref 8.9–10.3)
Chloride: 106 mmol/L (ref 98–111)
Creatinine, Ser: 0.93 mg/dL (ref 0.44–1.00)
GFR, Estimated: >60 mL/min (ref >60)
Glucose, Bld: 203 mg/dL — ABNORMAL HIGH (ref 70–99)
Potassium: 3.4 mmol/L — ABNORMAL LOW (ref 3.5–5.1)
Sodium: 145 mmol/L (ref 135–145)
Total Bilirubin: 1 mg/dL (ref 0.3–1.2)
Total Protein: 8.3 g/dL — ABNORMAL HIGH (ref 6.5–8.1)

## 2022-08-14 LAB — BETA-HYDROXYBUTYRIC ACID: Beta-Hydroxybutyric Acid: 3.06 mmol/L — ABNORMAL HIGH (ref 0.05–0.27)

## 2022-08-14 LAB — URINALYSIS, ROUTINE W REFLEX MICROSCOPIC
Bilirubin Urine: NEGATIVE
Glucose, UA: >=500 mg/dL — AB
Ketones, ur: 20 mg/dL — AB
Nitrite: NEGATIVE
Protein, ur: 30 mg/dL — AB
Specific Gravity, Urine: 1.028 (ref 1.005–1.030)
WBC, UA: >50 WBC/hpf (ref 0–5)
pH: 5 (ref 5.0–8.0)

## 2022-08-14 LAB — LIPASE, BLOOD: Lipase: 25 U/L (ref 11–51)

## 2022-08-14 LAB — CREATININE, SERUM
Creatinine, Ser: 0.79 mg/dL (ref 0.44–1.00)
GFR, Estimated: >60 mL/min (ref >60)

## 2022-08-14 LAB — GLUCOSE, CAPILLARY
Glucose-Capillary: 144 mg/dL — ABNORMAL HIGH (ref 70–99)
Glucose-Capillary: 113 mg/dL — ABNORMAL HIGH (ref 70–99)

## 2022-08-14 MED ORDER — HYDROMORPHONE HCL 1 MG/ML IJ SOLN
0.5000 mg | INTRAMUSCULAR | Status: DC | PRN
  Administered 2022-08-14 – 2022-08-16 (×4): 1 mg via INTRAVENOUS
  Filled 2022-08-14 (×6): qty 1

## 2022-08-14 MED ORDER — INSULIN ASPART 100 UNIT/ML IJ SOLN
0.0000 [IU] | Freq: Three times a day (TID) | INTRAMUSCULAR | Status: DC
  Administered 2022-08-14 – 2022-08-16 (×4): 2 [IU] via SUBCUTANEOUS
  Filled 2022-08-14: qty 0.15

## 2022-08-14 MED ORDER — ONDANSETRON 4 MG PO TBDP
4.0000 mg | ORAL_TABLET | Freq: Once | ORAL | Status: AC
  Administered 2022-08-14: 4 mg via ORAL
  Filled 2022-08-14: qty 1

## 2022-08-14 MED ORDER — IOHEXOL 300 MG/ML  SOLN
100.0000 mL | Freq: Once | INTRAMUSCULAR | Status: AC | PRN
  Administered 2022-08-14: 100 mL via INTRAVENOUS

## 2022-08-14 MED ORDER — HYDRALAZINE HCL 20 MG/ML IJ SOLN: 10.0000 mg | Freq: Four times a day (QID) | INTRAMUSCULAR | Status: DC | PRN

## 2022-08-14 MED ORDER — MORPHINE SULFATE (PF) 4 MG/ML IV SOLN: 4.0000 mg | Freq: Once | INTRAVENOUS | Status: DC

## 2022-08-14 MED ORDER — ACETAMINOPHEN 325 MG PO TABS
650.0000 mg | ORAL_TABLET | Freq: Four times a day (QID) | ORAL | Status: DC | PRN
  Administered 2022-08-14: 650 mg via ORAL
  Filled 2022-08-14: qty 2

## 2022-08-14 MED ORDER — ENOXAPARIN SODIUM 40 MG/0.4ML IJ SOSY
40.0000 mg | PREFILLED_SYRINGE | INTRAMUSCULAR | Status: DC
  Administered 2022-08-14 – 2022-08-15 (×2): 40 mg via SUBCUTANEOUS
  Filled 2022-08-14 (×2): qty 0.4

## 2022-08-14 MED ORDER — FAMOTIDINE IN NACL 20-0.9 MG/50ML-% IV SOLN
20.0000 mg | Freq: Once | INTRAVENOUS | Status: AC
  Administered 2022-08-14: 20 mg via INTRAVENOUS
  Filled 2022-08-14: qty 50

## 2022-08-14 MED ORDER — GABAPENTIN 400 MG PO CAPS
800.0000 mg | ORAL_CAPSULE | Freq: Four times a day (QID) | ORAL | Status: DC
  Administered 2022-08-14 – 2022-08-16 (×7): 800 mg via ORAL
  Filled 2022-08-14 (×7): qty 2

## 2022-08-14 MED ORDER — HYDROMORPHONE HCL 1 MG/ML IJ SOLN
1.0000 mg | Freq: Once | INTRAMUSCULAR | Status: AC
  Administered 2022-08-14: 1 mg via INTRAVENOUS
  Filled 2022-08-14: qty 1

## 2022-08-14 MED ORDER — FENTANYL CITRATE PF 50 MCG/ML IJ SOSY
25.0000 ug | PREFILLED_SYRINGE | Freq: Once | INTRAMUSCULAR | Status: AC
  Administered 2022-08-14: 25 ug via INTRAVENOUS
  Filled 2022-08-14: qty 1

## 2022-08-14 MED ORDER — POLYETHYLENE GLYCOL 3350 17 G PO PACK: 17.0000 g | PACK | Freq: Every day | ORAL | Status: DC | PRN

## 2022-08-14 MED ORDER — TRAZODONE HCL 50 MG PO TABS
25.0000 mg | ORAL_TABLET | Freq: Every evening | ORAL | Status: DC | PRN
  Administered 2022-08-14 – 2022-08-15 (×2): 25 mg via ORAL
  Filled 2022-08-14 (×2): qty 1

## 2022-08-14 MED ORDER — FLUTICASONE PROPIONATE 50 MCG/ACT NA SUSP
1.0000 | Freq: Every day | NASAL | Status: DC
  Administered 2022-08-15: 1 via NASAL
  Filled 2022-08-14: qty 16

## 2022-08-14 MED ORDER — SODIUM CHLORIDE 0.9 % IV SOLN
12.5000 mg | Freq: Four times a day (QID) | INTRAVENOUS | Status: DC | PRN
  Administered 2022-08-14: 12.5 mg via INTRAVENOUS
  Filled 2022-08-14: qty 0.5
  Filled 2022-08-14: qty 12.5

## 2022-08-14 MED ORDER — DOCUSATE SODIUM 100 MG PO CAPS
100.0000 mg | ORAL_CAPSULE | Freq: Two times a day (BID) | ORAL | Status: DC
  Administered 2022-08-14 – 2022-08-16 (×3): 100 mg via ORAL
  Filled 2022-08-14 (×4): qty 1

## 2022-08-14 MED ORDER — ONDANSETRON HCL 4 MG PO TABS: 4.0000 mg | ORAL_TABLET | Freq: Four times a day (QID) | ORAL | Status: DC | PRN

## 2022-08-14 MED ORDER — INSULIN ASPART 100 UNIT/ML IJ SOLN
0.0000 [IU] | Freq: Every day | INTRAMUSCULAR | Status: DC
  Filled 2022-08-14: qty 0.05

## 2022-08-14 MED ORDER — OXYCODONE HCL 5 MG PO TABS
5.0000 mg | ORAL_TABLET | ORAL | Status: DC | PRN
  Administered 2022-08-14 – 2022-08-15 (×4): 5 mg via ORAL
  Filled 2022-08-14 (×4): qty 1

## 2022-08-14 MED ORDER — SODIUM CHLORIDE 0.9 % IV BOLUS
1000.0000 mL | Freq: Once | INTRAVENOUS | Status: AC
  Administered 2022-08-14: 1000 mL via INTRAVENOUS

## 2022-08-14 MED ORDER — ONDANSETRON HCL 4 MG/2ML IJ SOLN: 4.0000 mg | Freq: Four times a day (QID) | INTRAMUSCULAR | Status: DC | PRN

## 2022-08-14 MED ORDER — SODIUM CHLORIDE 0.9 % IV SOLN
1.0000 g | INTRAVENOUS | Status: DC
  Administered 2022-08-15: 1 g via INTRAVENOUS
  Filled 2022-08-14: qty 10

## 2022-08-14 MED ORDER — ACETAMINOPHEN 650 MG RE SUPP: 650.0000 mg | Freq: Four times a day (QID) | RECTAL | Status: DC | PRN

## 2022-08-14 MED ORDER — SODIUM CHLORIDE (PF) 0.9 % IJ SOLN
INTRAMUSCULAR | Status: AC
  Filled 2022-08-14: qty 50

## 2022-08-14 MED ORDER — TIZANIDINE HCL 4 MG PO TABS
4.0000 mg | ORAL_TABLET | Freq: Four times a day (QID) | ORAL | Status: DC | PRN
  Administered 2022-08-14: 4 mg via ORAL
  Administered 2022-08-15: 8 mg via ORAL
  Filled 2022-08-14: qty 2
  Filled 2022-08-14: qty 1

## 2022-08-14 MED ORDER — ATORVASTATIN CALCIUM 40 MG PO TABS
40.0000 mg | ORAL_TABLET | Freq: Every day | ORAL | Status: DC
  Administered 2022-08-14 – 2022-08-16 (×3): 40 mg via ORAL
  Filled 2022-08-14 (×3): qty 1

## 2022-08-14 MED ORDER — POTASSIUM CHLORIDE 10 MEQ/100ML IV SOLN
10.0000 meq | INTRAVENOUS | Status: AC
  Administered 2022-08-14: 10 meq via INTRAVENOUS
  Filled 2022-08-14 (×2): qty 100

## 2022-08-14 MED ORDER — SODIUM CHLORIDE 0.9 % IV SOLN
2.0000 g | Freq: Once | INTRAVENOUS | Status: AC
  Administered 2022-08-14: 2 g via INTRAVENOUS
  Filled 2022-08-14: qty 20

## 2022-08-14 NOTE — ED Notes (Signed)
Pt placed on 2L [place] for O2 of 86% after pain meds

## 2022-08-14 NOTE — ED Notes (Signed)
ED TO INPATIENT HANDOFF REPORT  ED Nurse Name and Phone #: Thamas Jaegers Name/Age/Gender Jane Murphy 67 y.o. female Room/Bed: WA20/WA20  Code Status   Code Status: Full Code  Home/SNF/Other Home Patient oriented to: self, place, time, and situation Is this baseline? Yes   Triage Complete: Triage complete  Chief Complaint Pyelonephritis [N12]  Triage Note Per EMS, Pt, from home, c/o n/v/d and generalized abdominal pain x3 days.  Pain score 8/10.  Pt reports "the pain is from vomiting."  Pt reports taking ODT Zofran w/o relief.       Allergies Allergies  Allergen Reactions   Aspirin Anaphylaxis   Nsaids Anaphylaxis   Other Anaphylaxis    MSG   Metformin And Related     Diarrhea   Oxybutynin Other (See Comments)    Severe dry eyes   Valproic Acid And Related Other (See Comments)    Sedation   Food Hives and Other (See Comments)    Citrus Fruit also causes mouth ulcers.   Vancomycin Rash    Pt reports "Red Man Syndrome"    Level of Care/Admitting Diagnosis ED Disposition     ED Disposition  Admit   Condition  --   Comment  Hospital Area: [facility] [[id number]]  Level of Care: Med-Surg [16]  May admit patient to [facility] or [facility] if equivalent level of care is available:: Yes  Covid Evaluation: Asymptomatic - no recent exposure (last 10 days) testing not required  Diagnosis: Pyelonephritis [161096]  Admitting Physician: Maryln Gottron [[id number]]  Attending Physician: [facility], MIR [email or web address] [[id number]]  Certification:: I certify this patient will need inpatient services for at least 2 midnights  Estimated Length of Stay: 3          B Medical/Surgery History Past Medical History:  Diagnosis Date   Arthritis    Hypertension    Kidney stone    Osteoarthritis    PTSD (post-traumatic stress disorder)    Rectocele    Shingles    Trigger finger of right thumb    Past Surgical History:  Procedure Laterality Date    APPENDECTOMY     C section     CHOLECYSTECTOMY     HERNIA REPAIR     KNEE SURGERY     OOPHORECTOMY     SPINAL FUSION     TONSILLECTOMY       A IV Location/Drains/Wounds Patient Lines/Drains/Airways Status     Active Line/Drains/Airways     Name Placement date Placement time Site Days   Peripheral IV 08/14/22 22 G Left;Posterior Forearm 08/14/22  1128  Forearm  less than 1   Peripheral IV 08/14/22 20 G Posterior;Right Hand 08/14/22  1208  Hand  less than 1            Intake/Output Last 24 hours  Intake/Output Summary (Last 24 hours) at 08/14/2022 1536 Last data filed at 08/14/2022 1535 Gross per 24 hour  Intake 1150 ml  Output --  Net 1150 ml    Labs/Imaging Results for orders placed or performed during the hospital encounter of 08/14/22 (from the past 48 hour(s))  CBC with Differential     Status: Abnormal   Collection Time: 08/14/22 11:26 AM  Result Value Ref Range   WBC 17.0 (H) 4.0 - 10.5 K/uL   RBC 4.43 3.87 - 5.11 MIL/uL   Hemoglobin 13.9 12.0 - 15.0 g/dL   HCT 29.5 62.1 - 30.8 %   MCV 90.5 80.0 - 100.0 fL  MCH 31.4 26.0 - 34.0 pg   MCHC 34.7 30.0 - 36.0 g/dL   RDW 88.4 16.6 - 06.3 %   Platelets 475 (H) 150 - 400 K/uL   nRBC 0.0 0.0 - 0.2 %   Neutrophils Relative % 79 %   Neutro Abs 13.6 (H) 1.7 - 7.7 K/uL   Lymphocytes Relative 14 %   Lymphs Abs 2.4 0.7 - 4.0 K/uL   Monocytes Relative 4 %   Monocytes Absolute 0.7 0.1 - 1.0 K/uL   Eosinophils Relative 0 %   Eosinophils Absolute 0.0 0.0 - 0.5 K/uL   Basophils Relative 1 %   Basophils Absolute 0.1 0.0 - 0.1 K/uL   Immature Granulocytes 2 %   Abs Immature Granulocytes 0.31 (H) 0.00 - 0.07 K/uL    Comment: Performed at [facility], [address], [place], [place] [address]  Comprehensive metabolic panel     Status: Abnormal   Collection Time: 08/14/22 11:26 AM  Result Value Ref Range   Sodium 145 135 - 145 mmol/L   Potassium 3.4 (L) 3.5 - 5.1 mmol/L   Chloride 106 98 - 111  mmol/L   CO2 21 (L) 22 - 32 mmol/L   Glucose, Bld 203 (H) 70 - 99 mg/dL    Comment: Glucose reference range applies only to samples taken after fasting for at least 8 hours.   BUN 15 8 - 23 mg/dL   Creatinine, Ser 0.93 0.44 - 1.00 mg/dL   Calcium 9.5 8.9 - 23.5 mg/dL   Total Protein 8.3 (H) 6.5 - 8.1 g/dL   Albumin 3.9 3.5 - 5.0 g/dL   AST 25 15 - 41 U/L   ALT 37 0 - 44 U/L   Alkaline Phosphatase 77 38 - 126 U/L   Total Bilirubin 1.0 0.3 - 1.2 mg/dL   GFR, Estimated >57 >32 mL/min    Comment: (NOTE) Calculated using the CKD-EPI Creatinine Equation (2021)    Anion gap 18 (H) 5 - 15    Comment: Performed at [facility], [address] [place], [place] [address]  Lipase, blood     Status: None   Collection Time: 08/14/22 11:26 AM  Result Value Ref Range   Lipase 25 11 - 51 U/L    Comment: Performed at [facility], [address], [place], [place] [address]  Beta-hydroxybutyric acid     Status: Abnormal   Collection Time: 08/14/22 11:26 AM  Result Value Ref Range   Beta-Hydroxybutyric Acid 3.06 (H) 0.05 - 0.27 mmol/L    Comment: Performed at [facility], [address] [place], [place] [address]  Blood gas, venous (at [facility])     Status: Abnormal   Collection Time: 08/14/22 12:08 PM  Result Value Ref Range   pH, Ven 7.5 (H) 7.25 - 7.43   pCO2, Ven 33 (L) 44 - 60 mmHg   pO2, Ven 55 (H) 32 - 45 mmHg   Bicarbonate 25.7 20.0 - 28.0 mmol/L   Acid-Base Excess 2.9 (H) 0.0 - 2.0 mmol/L   O2 Saturation 90.2 %   Patient temperature 37.0     Comment: Performed at [facility], [address] [place], [place] [address]  Urinalysis, Routine w reflex microscopic -Urine, Clean Catch     Status: Abnormal   Collection Time: 08/14/22 12:20 PM  Result Value Ref Range   Color, Urine YELLOW YELLOW   APPearance HAZY (A) CLEAR   Specific Gravity, Urine 1.028  1.005 - 1.030   pH 5.0 5.0 - 8.0   Glucose, UA >=500  (A) NEGATIVE mg/dL   Hgb urine dipstick SMALL (A) NEGATIVE   Bilirubin Urine NEGATIVE NEGATIVE   Ketones, ur 20 (A) NEGATIVE mg/dL   Protein, ur 30 (A) NEGATIVE mg/dL   Nitrite NEGATIVE NEGATIVE   Leukocytes,Ua SMALL (A) NEGATIVE   RBC / HPF 0-5 0 - 5 RBC/hpf   WBC, UA >50 0 - 5 WBC/hpf   Bacteria, UA RARE (A) NONE SEEN   Squamous Epithelial / HPF 0-5 0 - 5 /HPF   WBC Clumps PRESENT     Comment: Performed at [facility], [address], [place], [place] [address]   CT ABDOMEN PELVIS W CONTRAST  Result Date: 08/14/2022 CLINICAL DATA:  Acute abdominal pain EXAM: CT ABDOMEN AND PELVIS WITH CONTRAST TECHNIQUE: Multidetector CT imaging of the abdomen and pelvis was performed using the standard protocol following bolus administration of intravenous contrast. RADIATION DOSE REDUCTION: This exam was performed according to the departmental dose-optimization program which includes automated exposure control, adjustment of the mA and/or kV according to patient size and/or use of iterative reconstruction technique. CONTRAST:  OMNIPAQUE IOHEXOL 300 MG/ML  SOLN COMPARISON:  CT examination dated June 17, 2021 FINDINGS: Lower chest: No acute abnormality. Hepatobiliary: No focal liver abnormality is seen. No gallstones, gallbladder wall thickening, or biliary dilatation. Pancreas: Unremarkable. No pancreatic ductal dilatation or surrounding inflammatory changes. Spleen: Normal in size without focal abnormality. Adrenals/Urinary Tract: Adrenal glands are unremarkable. There are wedge-shaped areas of low perfusion bilaterally (axial image 28; coronal image 94; sagittal images 62 and 148). There is also mild edema along the ureters bilaterally. Mild generalized thickening of the urinary bladder wall. Stomach/Bowel: Stomach is within normal limits. Appendix not identified. Colonic diverticulosis without evidence of acute diverticulitis. No evidence of bowel wall thickening, distention, or  inflammatory changes. Vascular/Lymphatic: Mild aortic atherosclerosis. No enlarged abdominal or pelvic lymph nodes. Reproductive: Status post hysterectomy. No adnexal masses. Other: No abdominal wall hernia or abnormality. No abdominopelvic ascites. 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 No acute osseous abnormality. IMPRESSION: 1. Wedge-shaped areas of low perfusion in the kidneys with mild edema along the ureters and mild generalized thickening of the urinary bladder wall. Findings are concerning for bilateral pyelonephritis. No perinephric fluid collection or abscess. Correlation with urinalysis is suggested. 2. Colonic diverticulosis without evidence of acute diverticulitis. 3. Mild aortic atherosclerosis. 4. Status post hysterectomy. No adnexal masses. 5. 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 No acute osseous abnormality. Electronically Signed   By: Larose Hires D.O.   On: 08/14/2022 14:26    Pending Labs Unresulted Labs (From admission, onward)     Start     Ordered   08/21/22 0500  Creatinine, serum  (enoxaparin (LOVENOX)    CrCl >/= 30 ml/min)  Weekly,   R     Comments: while on enoxaparin therapy    08/14/22 1532   08/15/22 0500  Comprehensive metabolic panel  Tomorrow morning,   R        08/14/22 1532   08/15/22 0500  CBC  Tomorrow morning,   R        08/14/22 1532   08/14/22 1534  Culture, blood (Routine X 2) w Reflex to ID Panel  BLOOD CULTURE X 2,   R      08/14/22 1533   08/14/22 1530  HIV Antibody (routine testing w rflx)  (HIV Antibody (Routine  testing w reflex) panel)  Once,   R        08/14/22 1532   08/14/22 1530  CBC  (enoxaparin (LOVENOX)    CrCl >/= 30 ml/min)  Once,   R       Comments: Baseline for enoxaparin therapy IF NOT ALREADY DRAWN.  Notify MD if PLT < 100 K.    08/14/22 1532   08/14/22 1530  Creatinine, serum  (enoxaparin (LOVENOX)    CrCl >/= 30 ml/min)  Once,   R       Comments: Baseline for  enoxaparin therapy IF NOT ALREADY DRAWN.    08/14/22 1532   08/14/22 1437  Urine Culture  Add-on,   AD       Question:  Indication  Answer:  Dysuria   08/14/22 1436            Vitals/Pain Today's Vitals   08/14/22 1425 08/14/22 1430 08/14/22 1500 08/14/22 1530  BP:  (!) 165/98 (!) 160/93 (!) 162/89  Pulse:  89 74 74  Resp:  20 18 18   Temp: 99.1 F (37.3 C)     TempSrc: Oral     SpO2:  99% 98% 100%  Weight:      Height:      PainSc:        Isolation Precautions No active isolations  Medications Medications  promethazine (PHENERGAN) 12.5 mg in sodium chloride 0.9 % 50 mL IVPB (0 mg Intravenous Stopped 08/14/22 1239)  cefTRIAXone (ROCEPHIN) 1 g in sodium chloride 0.9 % 100 mL IVPB (has no administration in time range)  atorvastatin (LIPITOR) tablet 40 mg (has no administration in time range)  gabapentin (NEURONTIN) capsule 800 mg (has no administration in time range)  tiZANidine (ZANAFLEX) tablet 4-8 mg (has no administration in time range)  fluticasone (FLONASE) 50 MCG/ACT nasal spray 1 spray (has no administration in time range)  insulin aspart (novoLOG) injection 0-15 Units (has no administration in time range)  enoxaparin (LOVENOX) injection 40 mg (has no administration in time range)  acetaminophen (TYLENOL) tablet 650 mg (has no administration in time range)    Or  acetaminophen (TYLENOL) suppository 650 mg (has no administration in time range)  traZODone (DESYREL) tablet 25 mg (has no administration in time range)  docusate sodium (COLACE) capsule 100 mg (has no administration in time range)  polyethylene glycol (MIRALAX / GLYCOLAX) packet 17 g (has no administration in time range)  ondansetron (ZOFRAN) tablet 4 mg (has no administration in time range)    Or  ondansetron (ZOFRAN) injection 4 mg (has no administration in time range)  HYDROmorphone (DILAUDID) injection 0.5-1 mg (has no administration in time range)  oxyCODONE (Oxy IR/ROXICODONE) immediate release  tablet 5 mg (has no administration in time range)  hydrALAZINE (APRESOLINE) injection 10 mg (has no administration in time range)  insulin aspart (novoLOG) injection 0-5 Units (has no administration in time range)  sodium chloride 0.9 % bolus 1,000 mL (0 mLs Intravenous Stopped 08/14/22 1450)  famotidine (PEPCID) IVPB 20 mg premix (0 mg Intravenous Stopped 08/14/22 1228)  fentaNYL (SUBLIMAZE) injection 25 mcg (25 mcg Intravenous Given 08/14/22 1218)  ondansetron (ZOFRAN-ODT) disintegrating tablet 4 mg (4 mg Oral Given 08/14/22 1139)  HYDROmorphone (DILAUDID) injection 1 mg (1 mg Intravenous Given 08/14/22 1247)  iohexol (OMNIPAQUE) 300 MG/ML solution 100 mL (100 mLs Intravenous Contrast Given 08/14/22 1330)  cefTRIAXone (ROCEPHIN) 2 g in sodium chloride 0.9 % 100 mL IVPB (0 g Intravenous Stopped 08/14/22 1535)  sodium chloride 0.9 %  bolus 1,000 mL (1,000 mLs Intravenous New Bag/Given 08/14/22 1455)    Mobility walks     Focused Assessments    R Recommendations: See Admitting Provider Note  Report given to:   Additional Notes:

## 2022-08-14 NOTE — Progress Notes (Signed)
Patient arrived on unit.

## 2022-08-14 NOTE — H&P (Signed)
History and Physical  Jane Murphy ZOX:[id number] DOB: [birth year] DOA: 08/14/2022  PCP: [facility]   Chief Complaint: Nausea/vomiting for 4 days  HPI: Jane Murphy is a 67 y.o. retired Engineer, civil (consulting) with medical history significant for non-insulin-dependent type 2 diabetes, hypertension being admitted to the hospital with bilateral pyelonephritis.  The patient states that 5 days ago, she noticed a drop of blood in her urine (she wears diapers due to a long history of urinary and fecal incontinence) and the following day she started having low back pain and intractable vomiting.  Denies fevers or chills, hematemesis, chest pain.  Couple days ago she started having abdominal soreness, feeling like somebody had punched her in the stomach.  She contacted her PCP, who told her to come to the ER for evaluation if she did not feel better.  ED Course: Upon evaluation in the ER, she was found to be hypertensive but otherwise has normal vital signs.  Lab work revealed leukocytosis, hyperglycemia without evidence of ketoacidosis, CT scan shows evidence of bilateral hydronephrosis.  She was given IV fluids, 2 g of empiric Rocephin, and hospitalist was contacted for admission.  Her nausea is improving.  Review of Systems: Please see HPI for pertinent positives and negatives. A complete 10 system review of systems are otherwise negative.  Past Medical History:  Diagnosis Date   Arthritis    Hypertension    Kidney stone    Osteoarthritis    PTSD (post-traumatic stress disorder)    Rectocele    Shingles    Trigger finger of right thumb    Past Surgical History:  Procedure Laterality Date   APPENDECTOMY     C section     CHOLECYSTECTOMY     HERNIA REPAIR     KNEE SURGERY     OOPHORECTOMY     SPINAL FUSION     TONSILLECTOMY      Social History:  reports that she has quit smoking. She has never used smokeless tobacco. She reports current alcohol use of about 4.0 standard drinks of  alcohol per week. She reports that she does not use drugs.   Allergies  Allergen Reactions   Aspirin Anaphylaxis   Nsaids Anaphylaxis   Other Anaphylaxis    MSG   Metformin And Related     Diarrhea   Oxybutynin Other (See Comments)    Severe dry eyes   Valproic Acid And Related Other (See Comments)    Sedation   Food Hives and Other (See Comments)    Citrus Fruit also causes mouth ulcers.   Vancomycin Rash    Pt reports "Red Man Syndrome"    History reviewed. No pertinent family history.   Prior to Admission medications   Medication Sig Start Date End Date Taking? Authorizing Provider  acetaminophen (TYLENOL) 500 MG tablet Take 1 tablet (500 mg total) by mouth 2 (two) times daily. Patient taking differently: Take 1,000 mg by mouth every 6 (six) hours as needed for mild pain. 04/22/18   Kallie Locks, FNP  Alogliptin Benzoate 12.5 MG TABS Take 12.5 mg by mouth daily.    [provider]  amoxicillin-clavulanate (AUGMENTIN) 875-125 MG tablet Take 1 tablet by mouth every 12 (twelve) hours. 04/27/22   Gustavus Bryant, FNP  atorvastatin (LIPITOR) 40 MG tablet Take 40 mg by mouth daily.    [provider]  Cyanocobalamin (VITAMIN B 12 PO) Take 1 tablet by mouth daily.    [provider]  docusate sodium (COLACE) 100 MG  capsule Take 100 mg by mouth daily as needed for mild constipation.    [provider]  fluticasone (FLONASE) 50 MCG/ACT nasal spray Place 1 spray into both nostrils daily as needed for allergies or rhinitis. Patient taking differently: Place 1 spray into both nostrils in the morning and at bedtime. 04/22/18   Kallie Locks, FNP  gabapentin (NEURONTIN) 400 MG capsule Take 800 mg by mouth in the morning, at noon, in the evening, and at bedtime. 06/11/21   [provider]  HYDROcodone-acetaminophen (NORCO/VICODIN) 5-325 MG tablet Take 1 tablet by mouth every 6 (six) hours as needed for severe pain. 04/27/22   Gustavus Bryant, FNP   ofloxacin (OCUFLOX) 0.3 % ophthalmic solution Place 1 drop into the left eye 4 (four) times daily. 04/27/22   Gustavus Bryant, FNP  ondansetron (ZOFRAN ODT) 4 MG disintegrating tablet Take 1 tablet (4 mg total) by mouth every 8 (eight) hours as needed for nausea or vomiting. 08/27/20   Robinson, [place] N, PA-C  polyethylene glycol (MIRALAX / GLYCOLAX) 17 g packet Take 17 g by mouth daily as needed for mild constipation.    [provider]  sodium chloride (OCEAN) 0.65 % SOLN nasal spray Place 1 spray into both nostrils daily.    [provider]  tiZANidine (ZANAFLEX) 4 MG tablet Take 4-8 mg by mouth every 6 (six) hours as needed for muscle spasms. 05/20/21   [provider]  valACYclovir (VALTREX) 500 MG tablet Take 500 mg by mouth daily.    [provider]  VITAMIN D PO Take 1 tablet by mouth daily.    [provider]    Physical Exam: BP (!) 162/89   Pulse 74   Temp 99.1 F (37.3 C) (Oral)   Resp 18   Ht 5\' 6"  (1.676 m)   Wt 76.2 kg   SpO2 100%   BMI 27.12 kg/m   General:  Alert, oriented, pleasant and cooperative, looks tired, but not in any acute distress. Eyes: EOMI, clear conjuctivae, white sclerea Neck: supple, no masses, trachea mildline  Cardiovascular: RRR, no murmurs or rubs, no peripheral edema  Respiratory: clear to auscultation bilaterally, no wheezes, no crackles  Abdomen: soft, nontender, nondistended, normal bowel tones heard  Skin: dry, no rashes  Musculoskeletal: no joint effusions, normal range of motion  Psychiatric: appropriate affect, normal speech  Neurologic: extraocular muscles intact, clear speech, moving all extremities with intact sensorium          Labs on Admission:  Basic Metabolic Panel: Recent Labs  Lab 08/14/22 1126  NA 145  K 3.4*  CL 106  CO2 21*  GLUCOSE 203*  BUN 15  CREATININE 0.93  CALCIUM 9.5   Liver Function Tests: Recent Labs  Lab 08/14/22 1126  AST 25  ALT 37  ALKPHOS 77   BILITOT 1.0  PROT 8.3*  ALBUMIN 3.9   Recent Labs  Lab 08/14/22 1126  LIPASE 25   No results for input(s): "AMMONIA" in the last 168 hours. CBC: Recent Labs  Lab 08/14/22 1126  WBC 17.0*  NEUTROABS 13.6*  HGB 13.9  HCT 40.1  MCV 90.5  PLT 475*   Cardiac Enzymes: No results for input(s): "CKTOTAL", "CKMB", "CKMBINDEX", "TROPONINI" in the last 168 hours.  BNP (last 3 results) No results for input(s): "BNP" in the last 8760 hours.  ProBNP (last 3 results) No results for input(s): "PROBNP" in the last 8760 hours.  CBG: No results for input(s): "GLUCAP" in the last 168  hours.  Radiological Exams on Admission: CT ABDOMEN PELVIS W CONTRAST  Result Date: 08/14/2022 CLINICAL DATA:  Acute abdominal pain EXAM: CT ABDOMEN AND PELVIS WITH CONTRAST TECHNIQUE: Multidetector CT imaging of the abdomen and pelvis was performed using the standard protocol following bolus administration of intravenous contrast. RADIATION DOSE REDUCTION: This exam was performed according to the departmental dose-optimization program which includes automated exposure control, adjustment of the mA and/or kV according to patient size and/or use of iterative reconstruction technique. CONTRAST:  OMNIPAQUE IOHEXOL 300 MG/ML  SOLN COMPARISON:  CT examination dated June 17, 2021 FINDINGS: Lower chest: No acute abnormality. Hepatobiliary: No focal liver abnormality is seen. No gallstones, gallbladder wall thickening, or biliary dilatation. Pancreas: Unremarkable. No pancreatic ductal dilatation or surrounding inflammatory changes. Spleen: Normal in size without focal abnormality. Adrenals/Urinary Tract: Adrenal glands are unremarkable. There are wedge-shaped areas of low perfusion bilaterally (axial image 28; coronal image 94; sagittal images 62 and 148). There is also mild edema along the ureters bilaterally. Mild generalized thickening of the urinary bladder wall. Stomach/Bowel: Stomach is within normal limits.  Appendix not identified. Colonic diverticulosis without evidence of acute diverticulitis. No evidence of bowel wall thickening, distention, or inflammatory changes. Vascular/Lymphatic: Mild aortic atherosclerosis. No enlarged abdominal or pelvic lymph nodes. Reproductive: Status post hysterectomy. No adnexal masses. Other: No abdominal wall hernia or abnormality. No abdominopelvic ascites. 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 No acute osseous abnormality. IMPRESSION: 1. Wedge-shaped areas of low perfusion in the kidneys with mild edema along the ureters and mild generalized thickening of the urinary bladder wall. Findings are concerning for bilateral pyelonephritis. No perinephric fluid collection or abscess. Correlation with urinalysis is suggested. 2. Colonic diverticulosis without evidence of acute diverticulitis. 3. Mild aortic atherosclerosis. 4. Status post hysterectomy. No adnexal masses. 5. 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 No acute osseous abnormality. Electronically Signed   By: Larose Hires D.O.   On: 08/14/2022 14:26    Assessment/Plan Principal Problem:   Acute pyelonephritis-this is the most likely cause of her nausea and vomiting, complicated by leukocytosis patient is not septic. -inpatient admission -Continue empiric IV Rocephin .- follow culture data    Hyperlipidemia   Essential hypertension - BP likely uncontrolled due to pain and nausea, continue home meds once reconciled and add PRN hydralazine     Type 2 diabetes mellitus with hyperglycemia (HCC) - DM diet when eating with SSI.    Hypokalemia - mild, due to vomiting, replete IV and recheck in AM  DVT prophylaxis: Lovenox     Code Status: Full Code  Consults called: None  Admission status: The appropriate patient status for this patient is INPATIENT. Inpatient status is judged to be reasonable and necessary in order to provide the required intensity of  service to ensure the patient's safety. The patient's presenting symptoms, physical exam findings, and initial radiographic and laboratory data in the context of their chronic comorbidities is felt to place them at high risk for further clinical deterioration. Furthermore, it is not anticipated that the patient will be medically stable for discharge from the hospital within 2 midnights of admission.    I certify that at the point of admission it is my clinical judgment that the patient will require inpatient hospital care spanning beyond 2 midnights from the point of admission due to high intensity of service, high risk for further deterioration and high frequency of surveillance required   Time spent: 53 minutes  Tonae Livolsi Sharlette Dense  MD [facility] Pager [phone]  If 7PM-7AM, please contact night-coverage [email or web address] Password [facility]  08/14/2022, 3:39 PM

## 2022-08-14 NOTE — ED Notes (Signed)
Attempted IV x 2 and both blew

## 2022-08-14 NOTE — ED Provider Notes (Signed)
[facility] AT [facility] Provider Note   CSN: [id number] Arrival date & time: 08/14/22  0915     History  Chief Complaint  Patient presents with   Emesis   Abdominal Pain    Jane Murphy is a 67 y.o. female with a history of type 2 diabetes presented to ED with abdominal pain, nausea vomiting and diarrhea for 3 days.  Patient ports not able to keep down oral medications including her diabetic medicines, or even Zofran.  She has persistent vomiting.  She feels dehydrated and weak.  She reports objective fevers at home.  Reports loose stools as well.  She reports a history of cholecystectomy and appendectomy in the past.  Denies history of bowel obstruction.  HPI     Home Medications Prior to Admission medications   Medication Sig Start Date End Date Taking? Authorizing Provider  acetaminophen (TYLENOL) 500 MG tablet Take 1 tablet (500 mg total) by mouth 2 (two) times daily. Patient taking differently: Take 1,000 mg by mouth every 6 (six) hours as needed for mild pain. 04/22/18   Kallie Locks, FNP  Alogliptin Benzoate 12.5 MG TABS Take 12.5 mg by mouth daily.    [provider]  amoxicillin-clavulanate (AUGMENTIN) 875-125 MG tablet Take 1 tablet by mouth every 12 (twelve) hours. 04/27/22   Gustavus Bryant, FNP  atorvastatin (LIPITOR) 40 MG tablet Take 40 mg by mouth daily.    [provider]  Cyanocobalamin (VITAMIN B 12 PO) Take 1 tablet by mouth daily.    [provider]  docusate sodium (COLACE) 100 MG capsule Take 100 mg by mouth daily as needed for mild constipation.    [provider]  fluticasone (FLONASE) 50 MCG/ACT nasal spray Place 1 spray into both nostrils daily as needed for allergies or rhinitis. Patient taking differently: Place 1 spray into both nostrils in the morning and at bedtime. 04/22/18   Kallie Locks, FNP  gabapentin (NEURONTIN) 400 MG capsule Take 800 mg by mouth in the morning, at  noon, in the evening, and at bedtime. 06/11/21   [provider]  HYDROcodone-acetaminophen (NORCO/VICODIN) 5-325 MG tablet Take 1 tablet by mouth every 6 (six) hours as needed for severe pain. 04/27/22   Gustavus Bryant, FNP  ofloxacin (OCUFLOX) 0.3 % ophthalmic solution Place 1 drop into the left eye 4 (four) times daily. 04/27/22   Gustavus Bryant, FNP  ondansetron (ZOFRAN ODT) 4 MG disintegrating tablet Take 1 tablet (4 mg total) by mouth every 8 (eight) hours as needed for nausea or vomiting. 08/27/20   Robinson, [place] N, PA-C  polyethylene glycol (MIRALAX / GLYCOLAX) 17 g packet Take 17 g by mouth daily as needed for mild constipation.    [provider]  sodium chloride (OCEAN) 0.65 % SOLN nasal spray Place 1 spray into both nostrils daily.    [provider]  tiZANidine (ZANAFLEX) 4 MG tablet Take 4-8 mg by mouth every 6 (six) hours as needed for muscle spasms. 05/20/21   [provider]  valACYclovir (VALTREX) 500 MG tablet Take 500 mg by mouth daily.    [provider]  VITAMIN D PO Take 1 tablet by mouth daily.    [provider]      Allergies    Aspirin, Nsaids, Other, Metformin and related, Oxybutynin, Valproic acid and related, Food, and Vancomycin    Review of Systems   Review of Systems  Physical Exam Updated Vital Signs BP Marland Kitchen)  160/93   Pulse 74   Temp 99.1 F (37.3 C) (Oral)   Resp 18   Ht 5\' 6"  (1.676 m)   Wt 76.2 kg   SpO2 98%   BMI 27.12 kg/m  Physical Exam Constitutional:      Comments: Dry heaving  HENT:     Head: Normocephalic and atraumatic.  Eyes:     Conjunctiva/sclera: Conjunctivae normal.     Pupils: Pupils are equal, round, and reactive to light.  Cardiovascular:     Rate and Rhythm: Normal rate and regular rhythm.  Pulmonary:     Effort: Pulmonary effort is normal. No respiratory distress.  Abdominal:     General: There is no distension.     Tenderness: There is no abdominal tenderness.  Skin:     General: Skin is warm and dry.  Neurological:     General: No focal deficit present.     Mental Status: She is alert. Mental status is at baseline.  Psychiatric:        Mood and Affect: Mood normal.        Behavior: Behavior normal.     ED Results / Procedures / Treatments   Labs (all labs ordered are listed, but only abnormal results are displayed) Labs Reviewed  CBC WITH DIFFERENTIAL/PLATELET - Abnormal; Notable for the following components:      Result Value   WBC 17.0 (*)    Platelets 475 (*)    Neutro Abs 13.6 (*)    Abs Immature Granulocytes 0.31 (*)    All other components within normal limits  COMPREHENSIVE METABOLIC PANEL - Abnormal; Notable for the following components:   Potassium 3.4 (*)    CO2 21 (*)    Glucose, Bld 203 (*)    Total Protein 8.3 (*)    Anion gap 18 (*)    All other components within normal limits  BETA-HYDROXYBUTYRIC ACID - Abnormal; Notable for the following components:   Beta-Hydroxybutyric Acid 3.06 (*)    All other components within normal limits  BLOOD GAS, VENOUS - Abnormal; Notable for the following components:   pH, Ven 7.5 (*)    pCO2, Ven 33 (*)    pO2, Ven 55 (*)    Acid-Base Excess 2.9 (*)    All other components within normal limits  URINALYSIS, ROUTINE W REFLEX MICROSCOPIC - Abnormal; Notable for the following components:   APPearance HAZY (*)    Glucose, UA >=500 (*)    Hgb urine dipstick SMALL (*)    Ketones, ur 20 (*)    Protein, ur 30 (*)    Leukocytes,Ua SMALL (*)    Bacteria, UA RARE (*)    All other components within normal limits  URINE CULTURE  LIPASE, BLOOD    EKG EKG Interpretation  Date/Time:  Friday Aug 14 2022 09:28:19 EDT Ventricular Rate:  65 PR Interval:  123 QRS Duration: 101 QT Interval:  441 QTC Calculation: 459 R Axis:   9 Text Interpretation: Sinus rhythm Abnormal R-wave progression, early transition Confirmed by Alvester Chou [id number]) on 08/14/2022 12:20:03 PM  Radiology CT ABDOMEN PELVIS  W CONTRAST  Result Date: 08/14/2022 CLINICAL DATA:  Acute abdominal pain EXAM: CT ABDOMEN AND PELVIS WITH CONTRAST TECHNIQUE: Multidetector CT imaging of the abdomen and pelvis was performed using the standard protocol following bolus administration of intravenous contrast. RADIATION DOSE REDUCTION: This exam was performed according to the departmental dose-optimization program which includes automated exposure control, adjustment of the mA and/or kV according to patient size  and/or use of iterative reconstruction technique. CONTRAST:  OMNIPAQUE IOHEXOL 300 MG/ML  SOLN COMPARISON:  CT examination dated June 17, 2021 FINDINGS: Lower chest: No acute abnormality. Hepatobiliary: No focal liver abnormality is seen. No gallstones, gallbladder wall thickening, or biliary dilatation. Pancreas: Unremarkable. No pancreatic ductal dilatation or surrounding inflammatory changes. Spleen: Normal in size without focal abnormality. Adrenals/Urinary Tract: Adrenal glands are unremarkable. There are wedge-shaped areas of low perfusion bilaterally (axial image 28; coronal image 94; sagittal images 62 and 148). There is also mild edema along the ureters bilaterally. Mild generalized thickening of the urinary bladder wall. Stomach/Bowel: Stomach is within normal limits. Appendix not identified. Colonic diverticulosis without evidence of acute diverticulitis. No evidence of bowel wall thickening, distention, or inflammatory changes. Vascular/Lymphatic: Mild aortic atherosclerosis. No enlarged abdominal or pelvic lymph nodes. Reproductive: Status post hysterectomy. No adnexal masses. Other: No abdominal wall hernia or abnormality. No abdominopelvic ascites. 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 No acute osseous abnormality. IMPRESSION: 1. Wedge-shaped areas of low perfusion in the kidneys with mild edema along the ureters and mild generalized thickening of the urinary bladder wall. Findings  are concerning for bilateral pyelonephritis. No perinephric fluid collection or abscess. Correlation with urinalysis is suggested. 2. Colonic diverticulosis without evidence of acute diverticulitis. 3. Mild aortic atherosclerosis. 4. Status post hysterectomy. No adnexal masses. 5. 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 No acute osseous abnormality. Electronically Signed   By: Larose Hires D.O.   On: 08/14/2022 14:26    Procedures Procedures    Medications Ordered in ED Medications  promethazine (PHENERGAN) 12.5 mg in sodium chloride 0.9 % 50 mL IVPB (0 mg Intravenous Stopped 08/14/22 1239)  cefTRIAXone (ROCEPHIN) 2 g in sodium chloride 0.9 % 100 mL IVPB (2 g Intravenous New Bag/Given 08/14/22 1458)  sodium chloride 0.9 % bolus 1,000 mL (0 mLs Intravenous Stopped 08/14/22 1450)  famotidine (PEPCID) IVPB 20 mg premix (0 mg Intravenous Stopped 08/14/22 1228)  fentaNYL (SUBLIMAZE) injection 25 mcg (25 mcg Intravenous Given 08/14/22 1218)  ondansetron (ZOFRAN-ODT) disintegrating tablet 4 mg (4 mg Oral Given 08/14/22 1139)  HYDROmorphone (DILAUDID) injection 1 mg (1 mg Intravenous Given 08/14/22 1247)  iohexol (OMNIPAQUE) 300 MG/ML solution 100 mL (100 mLs Intravenous Contrast Given 08/14/22 1330)  sodium chloride 0.9 % bolus 1,000 mL (1,000 mLs Intravenous New Bag/Given 08/14/22 1455)    ED Course/ Medical Decision Making/ A&P Clinical Course as of 08/14/22 1524  Fri Aug 14, 2022  1436 CT findings concerning for potential pyelonephritis.  IV antibiotics ordered with Rocephin.  Patient will be admitted to the hospital, high risk of diabetic complications, DKA and decompensation.  No acidosis at this time - will continue to hydrated with IV fluids [MT]  1437 Lower concern clinically for sepsis at this time [MT]  1523 Admitted to hospitalist [MT]    Clinical Course User Index [MT] Kylin Genna, Kermit Balo, MD                             Medical Decision Making Amount and/or Complexity  of Data Reviewed Labs: ordered. Radiology: ordered. ECG/medicine tests: ordered.  Risk Prescription drug management. Decision regarding hospitalization.   This patient presents to the ED with concern for nausea/vomiting diarrhea. This involves an extensive number of treatment options, and is a complaint that carries with it a high risk of complications and morbidity.  The differential diagnosis includes viral gastroenteritis versus diabetic ketoacidosis  versus electrolyte derangement versus intra-abdominal inflammation or colitis versus other  Co-morbidities that complicate the patient evaluation: History of diabetes, at high risk of metabolic and glycemic complications  External records from outside source obtained and reviewed including ha1c last checked March 2023 was 6.2  I ordered and personally interpreted labs.  The pertinent results include: Leukocytosis.  No acidosis.  UA with small leukocytes, negative nitrites.  Kidney function within normal limits.  Beta hydroxy level elevated.  I ordered imaging studies including CT abdomen pelvis I independently visualized and interpreted imaging which showed potential acute pyelonephritis pattern I agree with the radiologist interpretation  The patient was maintained on a cardiac monitor.  I personally viewed and interpreted the cardiac monitored which showed an underlying rhythm of: Sinus rhythm  Per my interpretation the patient's ECG shows no ischemic findings  I ordered medication including IV fluids, nausea medications.  IV pepcid for burning epigastric pain suspected gastritis from vomiting.  I have reviewed the patients home medicines and have made adjustments as needed  Test Considered: No respiratory complaints or symptoms to warrant emergent imaging of the chest at this time.  Low suspicion for pneumonia or PE.   After the interventions noted above, I reevaluated the patient and found that they have:  improved  Dispostion:  After consideration of the diagnostic results and the patients response to treatment, I feel that the patent would benefit from medical admission.         Final Clinical Impression(s) / ED Diagnoses Final diagnoses:  Pyelonephritis    Rx / DC Orders ED Discharge Orders     None         Tiffeny Minchew, Kermit Balo, MD 08/14/22 1525

## 2022-08-14 NOTE — Progress Notes (Addendum)
Patient being sent from ED.

## 2022-08-14 NOTE — ED Triage Notes (Signed)
Per EMS, Pt, from home, c/o n/v/d and generalized abdominal pain x3 days.  Pain score 8/10.  Pt reports "the pain is from vomiting."  Pt reports taking ODT Zofran w/o relief.

## 2022-08-15 DIAGNOSIS — N10 Acute tubulo-interstitial nephritis: Secondary | ICD-10-CM

## 2022-08-15 LAB — CBC
HCT: 39.1 % (ref 36.0–46.0)
Hemoglobin: 12.2 g/dL (ref 12.0–15.0)
MCH: 29.9 pg (ref 26.0–34.0)
MCHC: 31.2 g/dL (ref 30.0–36.0)
MCV: 95.8 fL (ref 80.0–100.0)
Platelets: 383 10*3/uL (ref 150–400)
RBC: 4.08 MIL/uL (ref 3.87–5.11)
RDW: 12.4 % (ref 11.5–15.5)
WBC: 17.4 10*3/uL — ABNORMAL HIGH (ref 4.0–10.5)
nRBC: 0 % (ref 0.0–0.2)

## 2022-08-15 LAB — COMPREHENSIVE METABOLIC PANEL
ALT: 27 U/L (ref 0–44)
AST: 22 U/L (ref 15–41)
Albumin: 3.6 g/dL (ref 3.5–5.0)
Alkaline Phosphatase: 67 U/L (ref 38–126)
Anion gap: 12 (ref 5–15)
BUN: 12 mg/dL (ref 8–23)
CO2: 27 mmol/L (ref 22–32)
Calcium: 9 mg/dL (ref 8.9–10.3)
Chloride: 104 mmol/L (ref 98–111)
Creatinine, Ser: 0.86 mg/dL (ref 0.44–1.00)
GFR, Estimated: >60 mL/min (ref >60)
Glucose, Bld: 109 mg/dL — ABNORMAL HIGH (ref 70–99)
Potassium: 3.1 mmol/L — ABNORMAL LOW (ref 3.5–5.1)
Sodium: 143 mmol/L (ref 135–145)
Total Bilirubin: 0.6 mg/dL (ref 0.3–1.2)
Total Protein: 7.4 g/dL (ref 6.5–8.1)

## 2022-08-15 LAB — GLUCOSE, CAPILLARY
Glucose-Capillary: 122 mg/dL — ABNORMAL HIGH (ref 70–99)
Glucose-Capillary: 126 mg/dL — ABNORMAL HIGH (ref 70–99)
Glucose-Capillary: 118 mg/dL — ABNORMAL HIGH (ref 70–99)
Glucose-Capillary: 106 mg/dL — ABNORMAL HIGH (ref 70–99)

## 2022-08-15 LAB — HIV ANTIBODY (ROUTINE TESTING W REFLEX): HIV Screen 4th Generation wRfx: NONREACTIVE

## 2022-08-15 LAB — URINE CULTURE

## 2022-08-15 LAB — CULTURE, BLOOD (ROUTINE X 2)

## 2022-08-15 MED ORDER — SIMETHICONE 80 MG PO CHEW
80.0000 mg | CHEWABLE_TABLET | Freq: Four times a day (QID) | ORAL | Status: DC | PRN
  Filled 2022-08-15: qty 1

## 2022-08-15 MED ORDER — TIZANIDINE HCL 4 MG PO TABS
4.0000 mg | ORAL_TABLET | ORAL | Status: DC | PRN
  Administered 2022-08-15 – 2022-08-16 (×4): 4 mg via ORAL
  Filled 2022-08-15 (×4): qty 1

## 2022-08-15 MED ORDER — POTASSIUM CHLORIDE CRYS ER 20 MEQ PO TBCR
40.0000 meq | EXTENDED_RELEASE_TABLET | Freq: Two times a day (BID) | ORAL | Status: DC
  Administered 2022-08-15 – 2022-08-16 (×3): 40 meq via ORAL
  Filled 2022-08-15 (×3): qty 2

## 2022-08-15 NOTE — Plan of Care (Signed)

## 2022-08-15 NOTE — Progress Notes (Signed)
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  In the emergency room hemodynamically stable.  Blood pressure stable.  WBC count elevated.  CT scan with evidence of bilateral pyelonephritis.  Urine abnormal.  Started on IV fluids, IV antibiotics and admitted to the hospital.   Assessment & Plan:   Acute UTI present on admission due to pyelonephritis, possibly recently passed renal stone. Blood cultures negative so far. Urine also with 60,000 colonies of E. coli. Continue Rocephin today until final sensitivity.  Voiding completely.  No evidence of hydronephrosis or obstructing stone.  Persistent nausea: Due to #1.  Already clinically improving.  Tolerating regular diet today.  Hypokalemia: Replaced.  Replace further today.  Chronic medical issues including Essential hypertension: Blood pressures stable.  Holding losartan. Type 2 diabetes: Last A1c less than 6 as per patient.  Patient on Jardiance.  She can discontinue. Chronic pain syndrome: On Zanaflex every 4 hours, gabapentin 800 mg 4 times daily.  Resume.    DVT prophylaxis: enoxaparin (LOVENOX) injection 40 mg Start: 08/14/22 2200 SCDs Start: 08/14/22 1530   Code Status: Full code Family Communication: None at the bedside Disposition Plan: Status is: Inpatient Remains inpatient appropriate because: Significant infection on IV antibiotics     Consultants:  None  Procedures:  None  Antimicrobials:  Rocephin 5/10----   Subjective: Patient seen and examined.  Nausea is better.  She had a rough night due to staying in the ER for more than  12 hours.  Mild abdominal pain along the left upper quadrant mostly improved now as she has a stopped vomiting. Remains afebrile. Uses Zanaflex every 4 hours even though she is sleeping.  Objective: Vitals:   08/14/22 2106 08/15/22 0209 08/15/22 0700 08/15/22 1352  BP: (!) 162/92 105/71 136/83 115/65  Pulse: 99 67 75 75  Resp:  16    Temp: 98.8 F (37.1 C) (!) 97.5 F (36.4 C) 99.3 F (37.4 C) 98.6 F (37 C)  TempSrc: Oral Oral Oral Oral  SpO2: 96% 97% 98% 96%  Weight:      Height:        Intake/Output Summary (Last 24 hours) at 08/15/2022 1443 Last data filed at 08/15/2022 0700 Gross per 24 hour  Intake 1328.65 ml  Output --  Net 1328.65 ml   Filed Weights   08/14/22 0926  Weight: 76.2 kg    Examination:  General exam: Appears calm and comfortable  Respiratory system: Clear to auscultation. Respiratory effort normal. Cardiovascular system: S1 & S2 heard, RRR. No pedal edema. Gastrointestinal system: Abdomen is nondistended, soft and nontender. No organomegaly or masses felt. Normal bowel sounds heard.  No costochondral tenderness. Central nervous system: Alert and oriented. No focal neurological deficits. Extremities: Symmetric 5 x 5 power. Skin: No rashes, lesions or ulcers Psychiatry: Judgement and insight appear normal. Mood & affect appropriate.     Data Reviewed: I have personally reviewed following labs and imaging studies  CBC: Recent Labs  Lab 08/14/22 1126 08/14/22 1946 08/15/22 0610  WBC 17.0* 18.3* 17.4*  NEUTROABS 13.6*  --   --   HGB 13.9 12.5 12.2  HCT 40.1 38.6 39.1  MCV 90.5 94.4  95.8  PLT 475* 441* 383   Basic Metabolic Panel: Recent Labs  Lab 08/14/22 1126 08/14/22 1946 08/15/22 0610  NA 145  --  143  K 3.4*  --  3.1*  CL 106  --  104  CO2 21*  --  27  GLUCOSE 203*  --  109*  BUN 15  --  12  CREATININE 0.93 0.79 0.86  CALCIUM 9.5  --  9.0   GFR: Estimated Creatinine Clearance: 68.1 mL/min (by C-G formula based on SCr of  0.86 mg/dL). Liver Function Tests: Recent Labs  Lab 08/14/22 1126 08/15/22 0610  AST 25 22  ALT 37 27  ALKPHOS 77 67  BILITOT 1.0 0.6  PROT 8.3* 7.4  ALBUMIN 3.9 3.6   Recent Labs  Lab 08/14/22 1126  LIPASE 25   No results for input(s): "AMMONIA" in the last 168 hours. Coagulation Profile: No results for input(s): "INR", "PROTIME" in the last 168 hours. Cardiac Enzymes: No results for input(s): "CKTOTAL", "CKMB", "CKMBINDEX", "TROPONINI" in the last 168 hours. BNP (last 3 results) No results for input(s): "PROBNP" in the last 8760 hours. HbA1C: No results for input(s): "HGBA1C" in the last 72 hours. CBG: Recent Labs  Lab 08/14/22 1833 08/14/22 2101 08/15/22 0729 08/15/22 1215  GLUCAP 144* 113* 122* 126*   Lipid Profile: No results for input(s): "CHOL", "HDL", "LDLCALC", "TRIG", "CHOLHDL", "LDLDIRECT" in the last 72 hours. Thyroid Function Tests: No results for input(s): "TSH", "T4TOTAL", "FREET4", "T3FREE", "THYROIDAB" in the last 72 hours. Anemia Panel: No results for input(s): "VITAMINB12", "FOLATE", "FERRITIN", "TIBC", "IRON", "RETICCTPCT" in the last 72 hours. Sepsis Labs: No results for input(s): "PROCALCITON", "LATICACIDVEN" in the last 168 hours.  Recent Results (from the past 240 hour(s))  Urine Culture     Status: Abnormal (Preliminary result)   Collection Time: 08/14/22 12:20 PM   Specimen: Urine, Clean Catch  Result Value Ref Range Status   Specimen Description   Final    URINE, CLEAN CATCH Performed at [facility], [address], [place], [place] [address]    Special Requests   Final    NONE Performed at [facility], [address] [place], [place] [address]    Culture (A)  Final    60,000 COLONIES/mL ESCHERICHIA COLI SUSCEPTIBILITIES TO FOLLOW Performed at [facility], [address] [place], [place] [address]    Report Status PENDING  Incomplete         Radiology Studies: CT ABDOMEN  PELVIS W CONTRAST  Result Date: 08/14/2022 CLINICAL DATA:  Acute abdominal pain EXAM: CT ABDOMEN AND PELVIS WITH CONTRAST TECHNIQUE: Multidetector CT imaging of the abdomen and pelvis was performed using the standard protocol following bolus administration of intravenous contrast. RADIATION DOSE REDUCTION: This exam was performed according to the departmental dose-optimization program which includes automated exposure control, adjustment of the mA and/or kV according to patient size and/or use of iterative reconstruction technique. CONTRAST:  OMNIPAQUE IOHEXOL 300 MG/ML  SOLN COMPARISON:  CT examination dated June 17, 2021 FINDINGS: Lower chest: No acute abnormality. Hepatobiliary: No focal liver abnormality is seen. No gallstones, gallbladder wall thickening, or biliary dilatation. Pancreas: Unremarkable. No pancreatic ductal dilatation or surrounding inflammatory changes. Spleen: Normal in size without focal abnormality. Adrenals/Urinary Tract: Adrenal glands are unremarkable. There are wedge-shaped areas of low perfusion bilaterally (axial image 28; coronal image 94; sagittal images 62 and 148). There is also mild edema along the ureters bilaterally. Mild generalized thickening of the urinary bladder  wall. Stomach/Bowel: Stomach is within normal limits. Appendix not identified. Colonic diverticulosis without evidence of acute diverticulitis. No evidence of bowel wall thickening, distention, or inflammatory changes. Vascular/Lymphatic: Mild aortic atherosclerosis. No enlarged abdominal or pelvic lymph nodes. Reproductive: Status post hysterectomy. No adnexal masses. Other: No abdominal wall hernia or abnormality. No abdominopelvic ascites. 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 No acute osseous abnormality. IMPRESSION: 1. Wedge-shaped areas of low perfusion in the kidneys with mild edema along the ureters and mild generalized thickening of the urinary bladder wall.  Findings are concerning for bilateral pyelonephritis. No perinephric fluid collection or abscess. Correlation with urinalysis is suggested. 2. Colonic diverticulosis without evidence of acute diverticulitis. 3. Mild aortic atherosclerosis. 4. Status post hysterectomy. No adnexal masses. 5. 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 No acute osseous abnormality. Electronically Signed   By: Larose Hires D.O.   On: 08/14/2022 14:26        Scheduled Meds:  atorvastatin  40 mg Oral Daily   docusate sodium  100 mg Oral BID   enoxaparin (LOVENOX) injection  40 mg Subcutaneous Q24H   fluticasone  1 spray Each Nare Daily   gabapentin  800 mg Oral QID   insulin aspart  0-15 Units Subcutaneous TID WC   insulin aspart  0-5 Units Subcutaneous QHS   potassium chloride  40 mEq Oral BID   Continuous Infusions:  cefTRIAXone (ROCEPHIN)  IV     promethazine (PHENERGAN) injection (IM or IVPB) Stopped (08/14/22 1239)     LOS: 1 day    Time spent: 35 minutes    Dorcas Carrow, MD [facility] Pager [phone]

## 2022-08-16 DIAGNOSIS — N10 Acute tubulo-interstitial nephritis: Secondary | ICD-10-CM | POA: Diagnosis not present

## 2022-08-16 LAB — BASIC METABOLIC PANEL
Anion gap: 9 (ref 5–15)
BUN: 12 mg/dL (ref 8–23)
CO2: 24 mmol/L (ref 22–32)
Calcium: 8.4 mg/dL — ABNORMAL LOW (ref 8.9–10.3)
Chloride: 104 mmol/L (ref 98–111)
Creatinine, Ser: 0.7 mg/dL (ref 0.44–1.00)
GFR, Estimated: >60 mL/min (ref >60)
Glucose, Bld: 147 mg/dL — ABNORMAL HIGH (ref 70–99)
Potassium: 3.9 mmol/L (ref 3.5–5.1)
Sodium: 137 mmol/L (ref 135–145)

## 2022-08-16 LAB — URINE CULTURE: Culture: 60000 — AB

## 2022-08-16 LAB — CBC WITH DIFFERENTIAL/PLATELET
Abs Immature Granulocytes: 0.09 10*3/uL — ABNORMAL HIGH (ref 0.00–0.07)
Basophils Absolute: 0.1 10*3/uL (ref 0.0–0.1)
Basophils Relative: 1 %
Eosinophils Absolute: 0.2 10*3/uL (ref 0.0–0.5)
Eosinophils Relative: 2 %
HCT: 32.8 % — ABNORMAL LOW (ref 36.0–46.0)
Hemoglobin: 10.5 g/dL — ABNORMAL LOW (ref 12.0–15.0)
Immature Granulocytes: 1 %
Lymphocytes Relative: 36 %
Lymphs Abs: 3.8 10*3/uL (ref 0.7–4.0)
MCH: 30.2 pg (ref 26.0–34.0)
MCHC: 32 g/dL (ref 30.0–36.0)
MCV: 94.3 fL (ref 80.0–100.0)
Monocytes Absolute: 0.7 10*3/uL (ref 0.1–1.0)
Monocytes Relative: 7 %
Neutro Abs: 5.6 10*3/uL (ref 1.7–7.7)
Neutrophils Relative %: 53 %
Platelets: 331 10*3/uL (ref 150–400)
RBC: 3.48 MIL/uL — ABNORMAL LOW (ref 3.87–5.11)
RDW: 12.1 % (ref 11.5–15.5)
WBC: 10.5 10*3/uL (ref 4.0–10.5)
nRBC: 0 % (ref 0.0–0.2)

## 2022-08-16 LAB — GLUCOSE, CAPILLARY: Glucose-Capillary: 121 mg/dL — ABNORMAL HIGH (ref 70–99)

## 2022-08-16 LAB — MAGNESIUM: Magnesium: 1.8 mg/dL (ref 1.7–2.4)

## 2022-08-16 MED ORDER — ONDANSETRON 4 MG PO TBDP: 4.0000 mg | ORAL_TABLET | Freq: Three times a day (TID) | ORAL | 0 refills | Status: AC | PRN

## 2022-08-16 MED ORDER — CEPHALEXIN 500 MG PO CAPS: 500.0000 mg | ORAL_CAPSULE | Freq: Three times a day (TID) | ORAL | 0 refills | Status: AC

## 2022-08-16 MED ORDER — SODIUM CHLORIDE 0.9 % IV SOLN
1.0000 g | Freq: Once | INTRAVENOUS | Status: AC
  Administered 2022-08-16: 1 g via INTRAVENOUS
  Filled 2022-08-16: qty 10

## 2022-08-16 MED ORDER — GABAPENTIN 400 MG PO CAPS: 800.0000 mg | ORAL_CAPSULE | Freq: Four times a day (QID) | ORAL | 0 refills | Status: AC

## 2022-08-16 MED ORDER — HYDROCODONE-ACETAMINOPHEN 5-325 MG PO TABS: 1.0000 | ORAL_TABLET | Freq: Four times a day (QID) | ORAL | 0 refills | Status: AC | PRN

## 2022-08-16 NOTE — Discharge Summary (Signed)
Physician Discharge Summary  Jane Murphy ZOX:[id number] DOB: 04/29/1955 DOA: 08/14/2022  PCP: [facility]  Admit date: 08/14/2022 Discharge date: 08/16/2022  Admitted From: Home Disposition: Home  Recommendations for Outpatient Follow-up:  Follow up with PCP in 1-2 weeks Please obtain BMP/CBC in one week Please follow up on the following pending results: Urine cultures.  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  In the emergency room hemodynamically stable.  Blood pressure stable.  WBC count elevated.  CT scan with evidence of bilateral pyelonephritis.  Urine abnormal.  Started on IV fluids, IV antibiotics and admitted to the hospital.     Acute UTI present on admission due to pyelonephritis, possibly recently passed renal stone. Blood cultures negative so far. Urine also with 60,000 colonies of E. coli.  Sensitive to cephalosporins. Clinically improved.  Voiding completely.  She does have evidence of pelvic floor laxity.  No evidence of hydronephrosis or obstructing stone. Rocephin day 3 today, she will continue under 4 days of Keflex 500 mg 3 times daily.   Persistent nausea: Due to #1.  Already clinically improving.  Tolerating regular diet today.   Hypokalemia: Replaced and adequate.   Chronic medical issues including Essential hypertension: Blood pressures stable.  Resume losartan.   Type 2 diabetes: Last A1c less than 6 as per patient.  Patient on Jardiance.    Chronic pain syndrome: On Zanaflex every 4 hours, gabapentin 800 mg 4 times daily.   Resume. Patient was prescribed a short course of Norco for flank pain to use as needed. Patient stated running out of gabapentin, refill was sent.  Medically stable for discharge.    Discharge Diagnoses:  Principal Problem:   Acute pyelonephritis Active Problems:   Hyperlipidemia   Essential hypertension   Type 2 diabetes mellitus with hyperglycemia (HCC)   Pyelonephritis   Hypokalemia    Discharge Instructions  Discharge Instructions     Diet Carb Modified   Complete by: As directed    Increase activity slowly   Complete by: As directed       Allergies as of 08/16/2022       Reactions   Aspirin Anaphylaxis   Monosodium Glutamate Anaphylaxis   Nsaids Anaphylaxis   Vancomycin Anaphylaxis, Rash, Other (See Comments)   Pt reports "Red Man Syndrome"   Citrus Hives, Other (See Comments)   Mouth ulcers   Divalproex Sodium Other (See Comments)   Extreme sedation   Metformin Diarrhea, Other (See Comments)   Severe diarrhea   Oxybutynin Other (See Comments)   Severely dry eyes/Aptyalism (disorder) and mouth ulcers   Tape Itching, Other (See Comments)   Skin redness   Valproic Acid And Related Other (See Comments)   Extreme Sedation        Medication List     STOP taking these medications    amoxicillin-clavulanate 875-125 MG tablet Commonly known as: AUGMENTIN   ofloxacin 0.3 % ophthalmic solution Commonly known as: Ocuflox       TAKE these medications    acetaminophen 500 MG tablet Commonly known as: TYLENOL Take 1 tablet (500 mg total) by mouth 2 (two) times daily. What changed:  how much to  take when to take this reasons to take this   acyclovir 800 MG tablet Commonly known as: ZOVIRAX Take 800 mg by mouth every 4 (four) hours as needed (as directed for outbreaks).   atorvastatin 40 MG tablet Commonly known as: LIPITOR Take 40 mg by mouth at bedtime.   Benadryl Allergy 25 mg capsule Generic drug: diphenhydrAMINE Take 25 mg by mouth See  admin instructions. Take 25 mg by mouth at 10:30 PM and 2:30 AM   cephALEXin 500 MG capsule Commonly known as: KEFLEX Take 1 capsule (500 mg total) by mouth 3 (three) times daily for 4 days.   cyanocobalamin 500 MCG tablet Commonly known as: VITAMIN B12 Take 1,000 mcg by mouth daily.   fluticasone 50 MCG/ACT nasal spray Commonly known as: FLONASE Place 1 spray into both nostrils daily as needed for allergies or rhinitis. What changed: when to take this   gabapentin 400 MG capsule Commonly known as: NEURONTIN Take 400 mg by mouth every 6 (six) hours.   HYDROcodone-acetaminophen 5-325 MG tablet Commonly known as: NORCO/VICODIN Take 1 tablet by mouth every 6 (six) hours as needed for up to 3 days for severe pain.   Jardiance 25 MG Tabs tablet Generic drug: empagliflozin Take 12.5 mg by mouth daily at 6 (six) AM.   loratadine 10 MG tablet Commonly known as: CLARITIN Take 10 mg by mouth daily at 6 (six) AM.   losartan 100 MG tablet Commonly known as: COZAAR Take 50 mg by mouth See admin instructions. Take 50 mg by mouth at 6:30 AM   montelukast 10 MG tablet Commonly known as: SINGULAIR Take 10 mg by mouth at bedtime.   ondansetron 4 MG disintegrating tablet Commonly known as: Zofran ODT Take 1 tablet (4 mg total) by mouth every 8 (eight) hours as needed for nausea or vomiting.   polyethylene glycol 17 g packet Commonly known as: MIRALAX / GLYCOLAX Take 17 g by mouth daily as needed for mild constipation.   sodium chloride 0.65 % Soln nasal spray Commonly known as: OCEAN Place 1 spray into both nostrils as needed for congestion (to moisturize the nassal passages).   tiZANidine 4 MG tablet Commonly known as: ZANAFLEX Take 4 mg by mouth every 6 (six) hours.   Vitamin D3 50 MCG (2000 UT) Tabs Take 2,000 Units by mouth daily.        Allergies  Allergen Reactions   Aspirin Anaphylaxis   Monosodium Glutamate Anaphylaxis   Nsaids Anaphylaxis   Vancomycin  Anaphylaxis, Rash and Other (See Comments)    Pt reports "Red Man Syndrome"   Citrus Hives and Other (See Comments)    Mouth ulcers   Divalproex Sodium Other (See Comments)    Extreme sedation   Metformin Diarrhea and Other (See Comments)    Severe diarrhea   Oxybutynin Other (See Comments)    Severely dry eyes/Aptyalism (disorder) and mouth ulcers   Tape Itching and Other (See Comments)    Skin redness   Valproic Acid And Related Other (See Comments)    Extreme Sedation    Consultations: None   Procedures/Studies: CT ABDOMEN PELVIS W CONTRAST  Result Date: 08/14/2022 CLINICAL DATA:  Acute abdominal pain EXAM: CT ABDOMEN AND PELVIS WITH CONTRAST TECHNIQUE: Multidetector CT imaging of the abdomen and pelvis was performed using the standard protocol following bolus administration of intravenous contrast. RADIATION DOSE REDUCTION: This exam was performed according to the departmental dose-optimization program which includes automated exposure control, adjustment of the mA and/or kV according to  patient size and/or use of iterative reconstruction technique. CONTRAST:  OMNIPAQUE IOHEXOL 300 MG/ML  SOLN COMPARISON:  CT examination dated June 17, 2021 FINDINGS: Lower chest: No acute abnormality. Hepatobiliary: No focal liver abnormality is seen. No gallstones, gallbladder wall thickening, or biliary dilatation. Pancreas: Unremarkable. No pancreatic ductal dilatation or surrounding inflammatory changes. Spleen: Normal in size without focal abnormality. Adrenals/Urinary Tract: Adrenal glands are unremarkable. There are wedge-shaped areas of low perfusion bilaterally (axial image 28; coronal image 94; sagittal images 62 and 148). There is also mild edema along the ureters bilaterally. Mild generalized thickening of the urinary bladder wall. Stomach/Bowel: Stomach is within normal limits. Appendix not identified. Colonic diverticulosis without evidence of acute diverticulitis. No evidence of bowel  wall thickening, distention, or inflammatory changes. Vascular/Lymphatic: Mild aortic atherosclerosis. No enlarged abdominal or pelvic lymph nodes. Reproductive: Status post hysterectomy. No adnexal masses. Other: No abdominal wall hernia or abnormality. No abdominopelvic ascites. 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 No acute osseous abnormality. IMPRESSION: 1. Wedge-shaped areas of low perfusion in the kidneys with mild edema along the ureters and mild generalized thickening of the urinary bladder wall. Findings are concerning for bilateral pyelonephritis. No perinephric fluid collection or abscess. Correlation with urinalysis is suggested. 2. Colonic diverticulosis without evidence of acute diverticulitis. 3. Mild aortic atherosclerosis. 4. Status post hysterectomy. No adnexal masses. 5. 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 No acute osseous abnormality. Electronically Signed   By: Larose Hires D.O.   On: 08/14/2022 14:26   (Echo, Carotid, EGD, Colonoscopy, ERCP)    Subjective: Patient seen and examined.  Overnight some dissatisfaction with the care.  Otherwise her symptoms are well-controlled.  Appetite is adequate and eating regular diet.  She still has occasional left flank pain.  Remains afebrile.  Blood pressures are adequate.  Eager to go home and missing her dog.   Discharge Exam: Vitals:   08/15/22 1956 08/16/22 0356  BP: 126/77 (!) 152/86  Pulse: 65 79  Resp:    Temp: 99.2 F (37.3 C) 100.2 F (37.9 C)  SpO2: 95% 91%   Vitals:   08/15/22 0700 08/15/22 1352 08/15/22 1956 08/16/22 0356  BP: 136/83 115/65 126/77 (!) 152/86  Pulse: 75 75 65 79  Resp:      Temp: 99.3 F (37.4 C) 98.6 F (37 C) 99.2 F (37.3 C) 100.2 F (37.9 C)  TempSrc: Oral Oral Oral Oral  SpO2: 98% 96% 95% 91%  Weight:      Height:        General: Pt is alert, awake, not in acute distress Walking in the hallway. Cardiovascular: RRR, S1/S2 +,  no rubs, no gallops Respiratory: CTA bilaterally, no wheezing, no rhonchi Abdominal: Soft, NT, ND, bowel sounds +, there is no palpable tenderness. Extremities: no edema, no cyanosis    The results of significant diagnostics from this hospitalization (including imaging, microbiology, ancillary and laboratory) are listed below for reference.     Microbiology: Recent Results (from the past 240 hour(s))  Urine Culture     Status: Abnormal (Preliminary result)   Collection Time: 08/14/22 12:20 PM   Specimen: Urine, Clean Catch  Result Value Ref Range Status   Specimen Description   Final    URINE, CLEAN CATCH Performed at [facility], [address] [place], [place] [address]    Special Requests   Final    NONE Performed at [facility], [address] [place], [place] [address]  Culture (A)  Final    60,000 COLONIES/mL ESCHERICHIA COLI SUSCEPTIBILITIES TO FOLLOW Performed at [facility], [address], [place], [place] [address]    Report Status PENDING  Incomplete  Culture, blood (Routine X 2) w Reflex to ID Panel     Status: None (Preliminary result)   Collection Time: 08/14/22  7:46 PM   Specimen: BLOOD LEFT HAND  Result Value Ref Range Status   Specimen Description   Final    BLOOD LEFT HAND BOTTLES DRAWN AEROBIC AND ANAEROBIC Performed at [facility], [address] [place], [place] [address]    Special Requests   Final    Blood Culture adequate volume Performed at [facility], [address], [place], [place] [address]    Culture   Final    NO GROWTH < 24 HOURS Performed at [facility], [address], [place], [place] [address]    Report Status PENDING  Incomplete  Culture, blood (Routine X 2) w Reflex to ID Panel     Status: None (Preliminary result)   Collection Time: 08/14/22  7:46 PM   Specimen: BLOOD  Result Value Ref Range Status   Specimen Description    Final    BLOOD BLOOD LEFT HAND AEROBIC BOTTLE ONLY Performed at [facility], [address], [place], [place] [address]    Special Requests   Final    Blood Culture results may not be optimal due to an inadequate volume of blood received in culture bottles Performed at [facility], [address] [place], [place] [address]    Culture   Final    NO GROWTH < 24 HOURS Performed at [facility], [address] [place], [place] [address]    Report Status PENDING  Incomplete     Labs: BNP (last 3 results) No results for input(s): "BNP" in the last 8760 hours. Basic Metabolic Panel: Recent Labs  Lab 08/14/22 1126 08/14/22 1946 08/15/22 0610 08/16/22 0555  NA 145  --  143 137  K 3.4*  --  3.1* 3.9  CL 106  --  104 104  CO2 21*  --  27 24  GLUCOSE 203*  --  109* 147*  BUN 15  --  12 12  CREATININE 0.93 0.79 0.86 0.70  CALCIUM 9.5  --  9.0 8.4*  MG  --   --   --  1.8   Liver Function Tests: Recent Labs  Lab 08/14/22 1126 08/15/22 0610  AST 25 22  ALT 37 27  ALKPHOS 77 67  BILITOT 1.0 0.6  PROT 8.3* 7.4  ALBUMIN 3.9 3.6   Recent Labs  Lab 08/14/22 1126  LIPASE 25   No results for input(s): "AMMONIA" in the last 168 hours. CBC: Recent Labs  Lab 08/14/22 1126 08/14/22 1946 08/15/22 0610 08/16/22 0555  WBC 17.0* 18.3* 17.4* 10.5  NEUTROABS 13.6*  --   --  5.6  HGB 13.9 12.5 12.2 10.5*  HCT 40.1 38.6 39.1 32.8*  MCV 90.5 94.4 95.8 94.3  PLT 475* 441* 383 331   Cardiac Enzymes: No results for input(s): "CKTOTAL", "CKMB", "CKMBINDEX", "TROPONINI" in the last 168 hours. BNP: Invalid input(s): "POCBNP" CBG: Recent Labs  Lab 08/15/22 0729 08/15/22 1215 08/15/22 1636 08/15/22 2027 08/16/22 0750  GLUCAP 122* 126* 118* 106* 121*   D-Dimer No results for input(s): "DDIMER" in the last 72 hours. Hgb A1c No results for input(s): "HGBA1C" in  the last 72 hours. Lipid Profile No results for input(s): "CHOL",  "HDL", "LDLCALC", "TRIG", "CHOLHDL", "LDLDIRECT" in the last 72 hours. Thyroid function studies No results for input(s): "TSH", "T4TOTAL", "T3FREE", "THYROIDAB" in the last 72 hours.  Invalid input(s): "FREET3" Anemia work up No results for input(s): "VITAMINB12", "FOLATE", "FERRITIN", "TIBC", "IRON", "RETICCTPCT" in the last 72 hours. Urinalysis    Component Value Date/Time   COLORURINE YELLOW 08/14/2022 1220   APPEARANCEUR HAZY (A) 08/14/2022 1220   LABSPEC 1.028 08/14/2022 1220   PHURINE 5.0 08/14/2022 1220   GLUCOSEU >=500 (A) 08/14/2022 1220   HGBUR SMALL (A) 08/14/2022 1220   BILIRUBINUR NEGATIVE 08/14/2022 1220   BILIRUBINUR negative 04/22/2018 1018   KETONESUR 20 (A) 08/14/2022 1220   PROTEINUR 30 (A) 08/14/2022 1220   UROBILINOGEN 0.2 04/22/2018 1018   UROBILINOGEN 0.2 08/22/2014 1328   NITRITE NEGATIVE 08/14/2022 1220   LEUKOCYTESUR SMALL (A) 08/14/2022 1220   Sepsis Labs Recent Labs  Lab 08/14/22 1126 08/14/22 1946 08/15/22 0610 08/16/22 0555  WBC 17.0* 18.3* 17.4* 10.5   Microbiology Recent Results (from the past 240 hour(s))  Urine Culture     Status: Abnormal (Preliminary result)   Collection Time: 08/14/22 12:20 PM   Specimen: Urine, Clean Catch  Result Value Ref Range Status   Specimen Description   Final    URINE, CLEAN CATCH Performed at [facility], [address] [place], [place] [address]    Special Requests   Final    NONE Performed at [facility], [address] [place], [place] [address]    Culture (A)  Final    60,000 COLONIES/mL ESCHERICHIA COLI SUSCEPTIBILITIES TO FOLLOW Performed at [facility], [address] [place], [place] [address]    Report Status PENDING  Incomplete  Culture, blood (Routine X 2) w Reflex to ID Panel     Status: None (Preliminary result)   Collection Time: 08/14/22  7:46 PM   Specimen: BLOOD LEFT HAND  Result Value Ref Range Status   Specimen Description    Final    BLOOD LEFT HAND BOTTLES DRAWN AEROBIC AND ANAEROBIC Performed at [facility], [address] [place], [place] [address]    Special Requests   Final    Blood Culture adequate volume Performed at [facility], [address], [place], [place] [address]    Culture   Final    NO GROWTH < 24 HOURS Performed at [facility], [address], [place], [place] [address]    Report Status PENDING  Incomplete  Culture, blood (Routine X 2) w Reflex to ID Panel     Status: None (Preliminary result)   Collection Time: 08/14/22  7:46 PM   Specimen: BLOOD  Result Value Ref Range Status   Specimen Description   Final    BLOOD BLOOD LEFT HAND AEROBIC BOTTLE ONLY Performed at [facility], [address], [place], [place] [address]    Special Requests   Final    Blood Culture results may not be optimal due to an inadequate volume of blood received in culture bottles Performed at [facility], [address] [place], [place] [address]    Culture   Final    NO GROWTH < 24 HOURS Performed at [facility], [address], [place], [place] [address]    Report Status PENDING  Incomplete     Time coordinating discharge: 35  minutes  SIGNED:  Dorcas Carrow, MD  [facility] 08/16/2022, 9:38 AM

## 2022-08-17 LAB — CULTURE, BLOOD (ROUTINE X 2)

## 2022-08-19 LAB — CULTURE, BLOOD (ROUTINE X 2)
Culture: NO GROWTH
Culture: NO GROWTH

## 2022-08-20 LAB — CULTURE, BLOOD (ROUTINE X 2): Special Requests: ADEQUATE

## 2023-01-05 DEATH — deceased

## 2023-09-06 IMAGING — CT CT ABD-PELV W/ CM
2 of 6 series · 15 of 46 positions shown, 17 images · IV contrast (OMNIPAQUE 300)
Comparison: 08/27/20

CLINICAL DATA: Right lower quadrant abdominal pain. Status post
hysterectomy 1 week ago

EXAM:
CT ABDOMEN AND PELVIS WITH CONTRAST
TECHNIQUE: Multidetector CT imaging of the abdomen and pelvis was performed
using the standard protocol following bolus administration of
intravenous contrast.

[Series 2: axial st · axial · 0.80mm/px · z∈[+1337,+1722]mm · 12 of 89 slices shown, 14 images]
[im 6/89  soft-tissue]
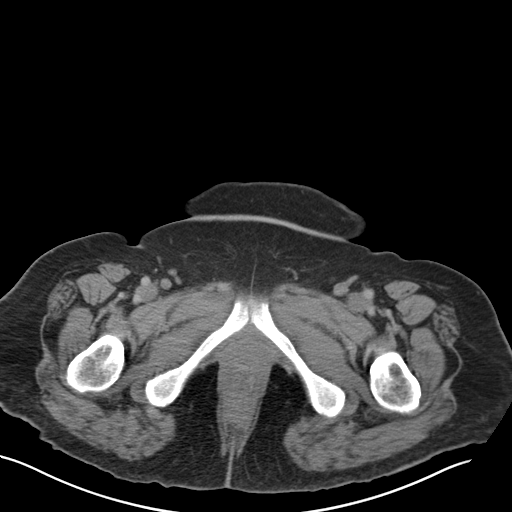
[im 6/89  bone]
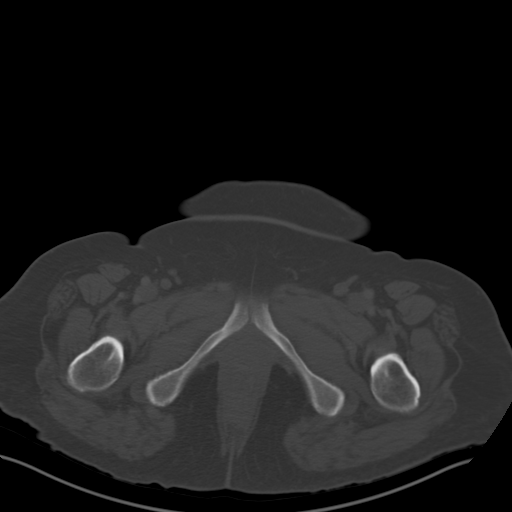
[im 12/89  soft-tissue]
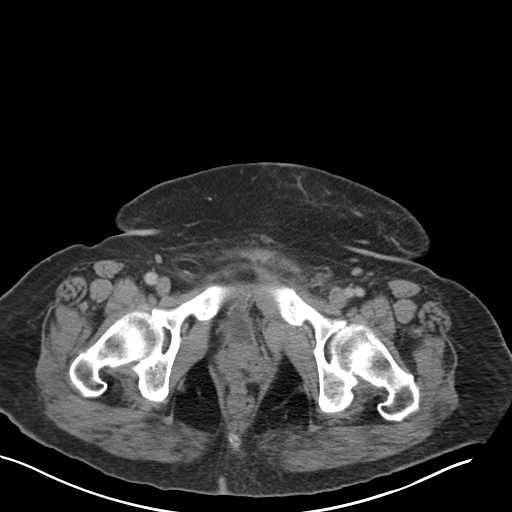
[im 18/89  soft-tissue]
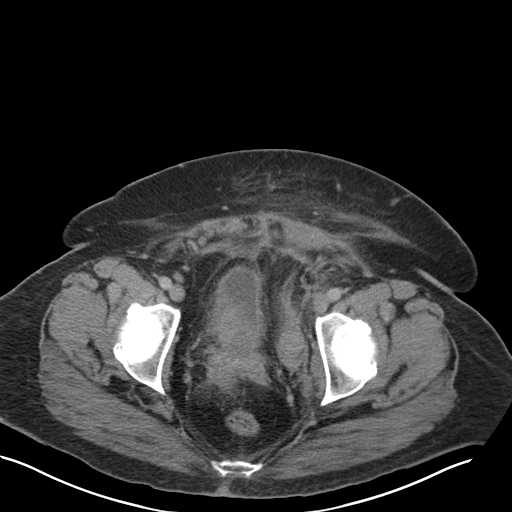
[im 30/89  soft-tissue]
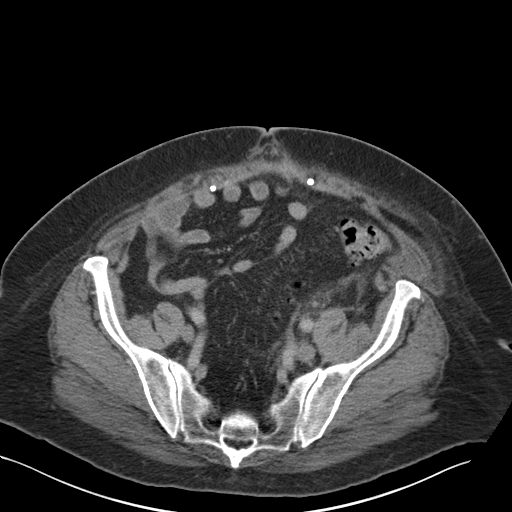
[im 36/89  soft-tissue]
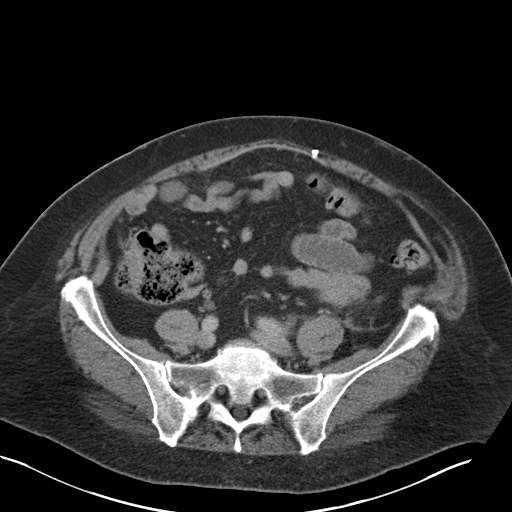
[im 42/89  soft-tissue]
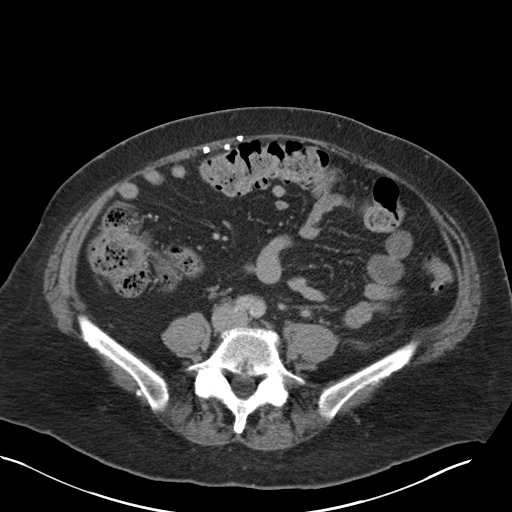
[im 47/89  soft-tissue]
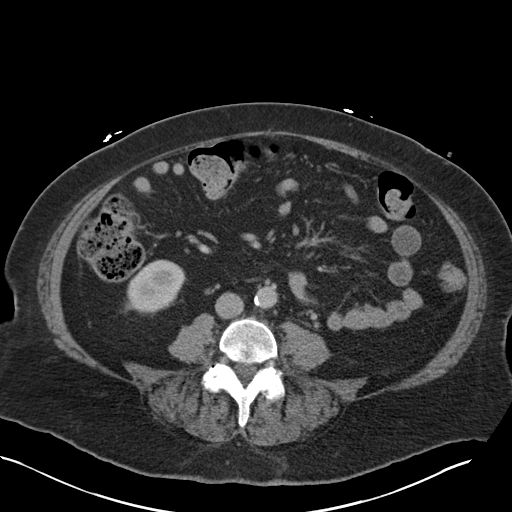
[im 53/89  soft-tissue]
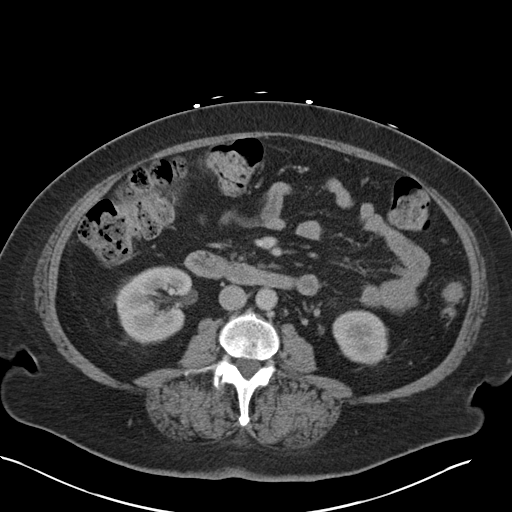
[im 59/89  soft-tissue]
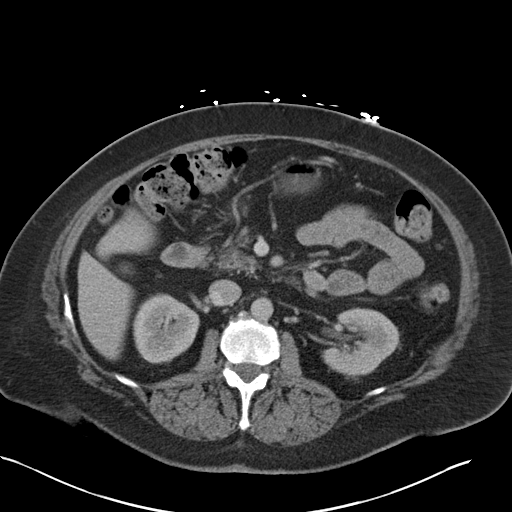
[im 59/89  bone]
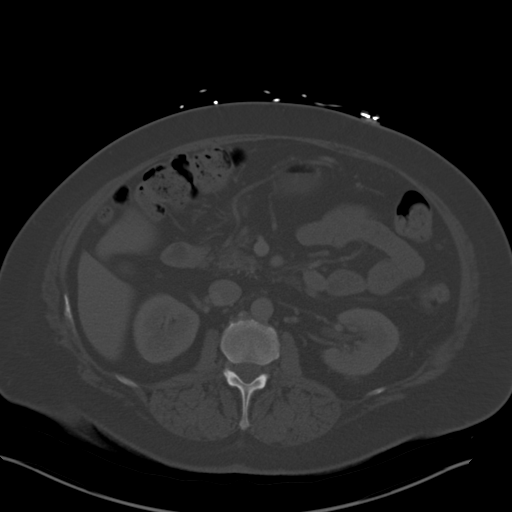
[im 71/89  soft-tissue]
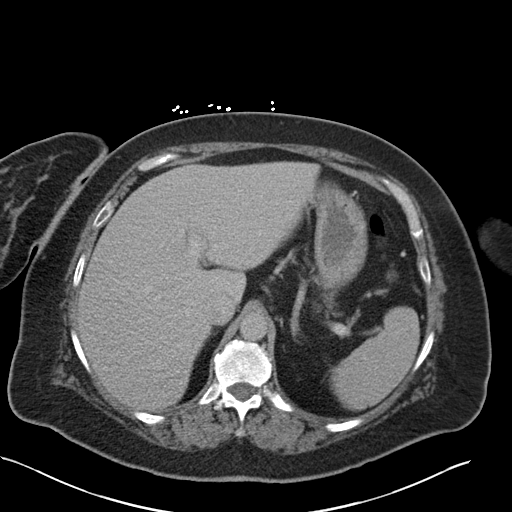
[im 77/89  soft-tissue]
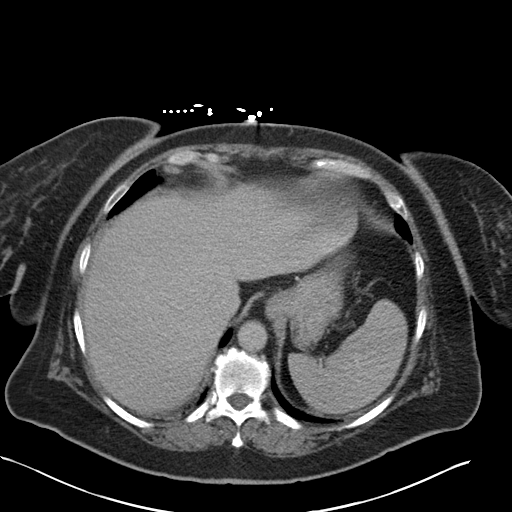
[im 83/89  soft-tissue]
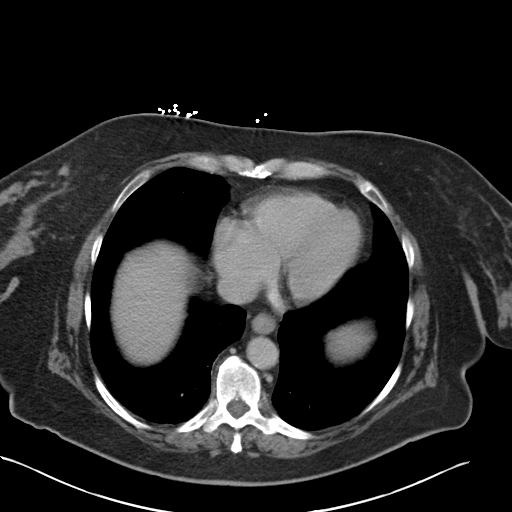

[Series 4: coronal st · coronal · 0.84mm/px · 3 of 156 slices shown]
[im 52/156  soft-tissue]
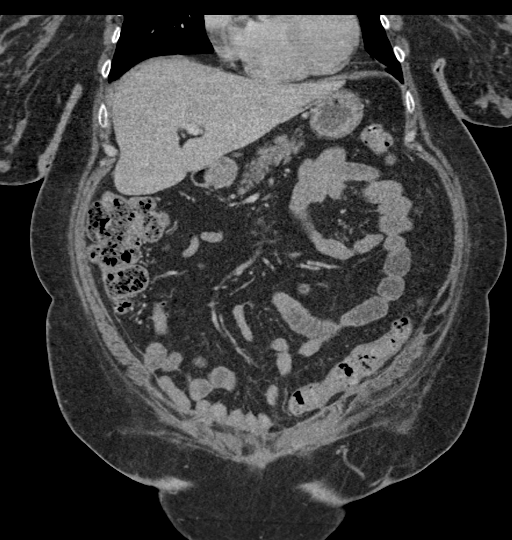
[im 69/156  soft-tissue]
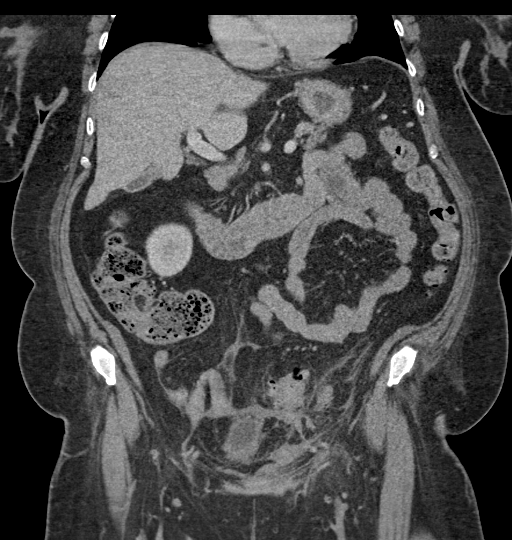
[im 87/156  soft-tissue]
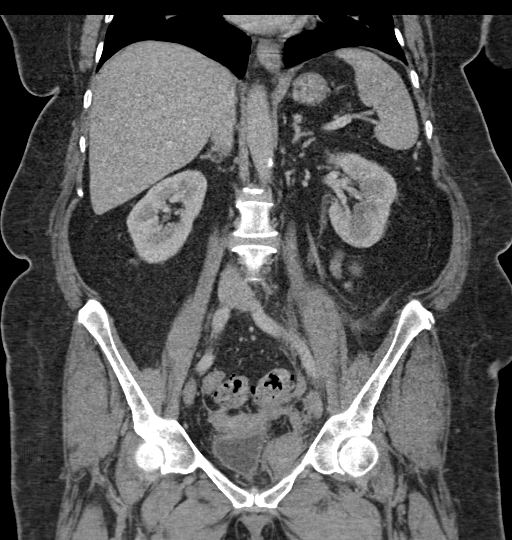

[15 of 46 positions shown; findings below may reference images not displayed]

RADIATION DOSE REDUCTION: This exam was performed according to the
departmental dose-optimization program which includes automated
exposure control, adjustment of the mA and/or kV according to
patient size and/or use of iterative reconstruction technique.

CONTRAST:  100mL OMNIPAQUE IOHEXOL 300 MG/ML  SOLN
FINDINGS: Lower chest: No acute abnormality.

Hepatobiliary: No focal liver abnormality is seen. No gallstones,
gallbladder wall thickening, or biliary dilatation.

Pancreas: Unremarkable. No pancreatic ductal dilatation or
surrounding inflammatory changes.

Spleen: Normal in size without focal abnormality.

Adrenals/Urinary Tract: Normal adrenal glands. No kidney mass or
hydronephrosis identified. No focal bladder abnormality identified.

Stomach/Bowel: Stomach appears normal. Status post appendectomy. No
small bowel wall thickening, inflammation, or distension. Sigmoid
diverticulosis identified. No signs of acute diverticulitis.

Vascular/Lymphatic: Mild aortic atherosclerosis. No aneurysm. No
abdominal adenopathy. There are several prominent left external
iliac lymph nodes identified which are likely reactive in the early
postoperative time frame. This includes a 1.1 cm left pelvic
sidewall lymph node, image 68/2. Left posterior pelvic sidewall
lymph node measures 1.2 cm, image 71/2.

Reproductive: Status post scratch set postoperative changes
consistent with recent total abdominal hysterectomy and right
salpingo-oophorectomy. Small, nonspecific fluid collection posterior
to the vaginal cuff measures 2 x 1.4 cm, image [DATE]. This is
nonspecific in the early postoperative time frame.

Other: Small volume of high attenuation soft tissue stranding within
the pelvis likely represents residual postoperative hemoperitoneum.
Within the left pelvic sidewall there is a high attenuation mass
measuring 6.9 x 3.2 by 4.3 cm compatible with extraperitoneal
hematoma.

No drainable fluid collections identified at this time.

Postoperative changes from ventral abdominal wall herniorrhaphy with
intact hernia mesh.

Soft tissue stranding is identified within the subcutaneous soft
tissues of the ventral abdominal wall, image 71/2.

Musculoskeletal: No acute or suspicious osseous findings. L5-S1
degenerative disc disease noted.
IMPRESSION: 1. Status post recent total abdominal hysterectomy and right
salpingo-oophorectomy. No specific findings identified to suggest
postoperative abscess. No signs of bowel obstruction.
2. There is a 6.9 x 3.2 x 4.3 cm hematoma within the left pelvic
sidewall. Likely postoperative
3. Small, fluid collection posterior to the vaginal cuff measures 2
x 1.4 cm. This is nonspecific in the early postoperative time frame,
and likely represents a small seroma.
4. Mildly prominent left external iliac lymph nodes are likely
reactive in the early postoperative time frame.
5. Sigmoid diverticulosis without signs of acute diverticulitis.
6. Aortic Atherosclerosis ([id number]).
